# Patient Record
Sex: Female | Born: 1970 | Race: Black or African American | Hispanic: No | Smoking: Never smoker
Health system: Southern US, Community
[De-identification: ages and names within clinical notes are randomized; demographics above are authoritative.]

## PROBLEM LIST (undated history)

## (undated) DIAGNOSIS — E079 Disorder of thyroid, unspecified: Secondary | ICD-10-CM

## (undated) DIAGNOSIS — E039 Hypothyroidism, unspecified: Secondary | ICD-10-CM

## (undated) DIAGNOSIS — J45909 Unspecified asthma, uncomplicated: Secondary | ICD-10-CM

## (undated) DIAGNOSIS — R51 Headache: Secondary | ICD-10-CM

## (undated) DIAGNOSIS — K7689 Other specified diseases of liver: Secondary | ICD-10-CM

## (undated) DIAGNOSIS — R112 Nausea with vomiting, unspecified: Secondary | ICD-10-CM

## (undated) DIAGNOSIS — K589 Irritable bowel syndrome without diarrhea: Secondary | ICD-10-CM

## (undated) DIAGNOSIS — I38 Endocarditis, valve unspecified: Secondary | ICD-10-CM

## (undated) DIAGNOSIS — I499 Cardiac arrhythmia, unspecified: Secondary | ICD-10-CM

## (undated) DIAGNOSIS — Z9889 Other specified postprocedural states: Secondary | ICD-10-CM

## (undated) DIAGNOSIS — R011 Cardiac murmur, unspecified: Secondary | ICD-10-CM

## (undated) HISTORY — DX: Disorder of thyroid, unspecified: E07.9

---

## 1999-12-08 HISTORY — PX: TUBAL LIGATION: SHX77

## 1999-12-08 HISTORY — PX: PARTIAL HYSTERECTOMY: SHX80

## 2006-01-18 ENCOUNTER — Ambulatory Visit: Payer: Self-pay | Admitting: Orthopedic Surgery

## 2006-02-15 ENCOUNTER — Ambulatory Visit: Payer: Self-pay | Admitting: Orthopedic Surgery

## 2007-05-27 ENCOUNTER — Ambulatory Visit: Payer: Self-pay | Admitting: Cardiovascular Disease

## 2007-06-07 ENCOUNTER — Encounter (HOSPITAL_COMMUNITY): Admission: RE | Admit: 2007-06-07 | Discharge: 2007-07-07 | Payer: Self-pay | Admitting: Cardiovascular Disease

## 2007-06-07 ENCOUNTER — Ambulatory Visit: Payer: Self-pay | Admitting: Cardiovascular Disease

## 2007-06-08 ENCOUNTER — Ambulatory Visit (HOSPITAL_COMMUNITY): Admission: RE | Admit: 2007-06-08 | Discharge: 2007-06-08 | Payer: Self-pay | Admitting: Pulmonary Disease

## 2007-08-12 ENCOUNTER — Ambulatory Visit (HOSPITAL_COMMUNITY): Admission: RE | Admit: 2007-08-12 | Discharge: 2007-08-12 | Payer: Self-pay | Admitting: Pulmonary Disease

## 2008-12-03 ENCOUNTER — Ambulatory Visit (HOSPITAL_COMMUNITY): Admission: RE | Admit: 2008-12-03 | Discharge: 2008-12-03 | Payer: Self-pay | Admitting: Pulmonary Disease

## 2010-12-07 HISTORY — PX: SHOULDER SURGERY: SHX246

## 2011-01-21 ENCOUNTER — Other Ambulatory Visit (HOSPITAL_COMMUNITY): Payer: Self-pay | Admitting: Pulmonary Disease

## 2011-01-26 ENCOUNTER — Ambulatory Visit (HOSPITAL_COMMUNITY)
Admission: RE | Admit: 2011-01-26 | Discharge: 2011-01-26 | Disposition: A | Discharge status: Home / Self Care | Payer: BC Managed Care – PPO | Source: Ambulatory Visit | Attending: Pulmonary Disease | Admitting: Pulmonary Disease

## 2011-01-26 DIAGNOSIS — R109 Unspecified abdominal pain: Secondary | ICD-10-CM | POA: Insufficient documentation

## 2011-01-26 DIAGNOSIS — K769 Liver disease, unspecified: Secondary | ICD-10-CM | POA: Insufficient documentation

## 2011-04-21 NOTE — Assessment & Plan Note (Signed)
[facility]                       Monica Lowery CARDIOLOGY OFFICE NOTE   Monica Lowery, Monica Lowery                  MRN:          [id number]  DATE:05/27/2007                            DOB:          06/15/1971    Monica Lowery is a delightful 40 year old diabetic who was referred by  Dr. Juanetta Gosling for possibility of stress test.  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  She has never had  a stress test.  In talking to her she does occasionally get chest pain  that sounds atypical, it is usually when she is under stress.  She also  gets occasional exertional dyspnea which sounds functional.  In regards  to the patient's pain it can be nonexertional, it is sharp, it does not  radiate, there is no associated diaphoresis.  The patient's dyspnea  again tends to be when she is trying to keep up with her kids.  There is  no history of asthma or COPD.  She is a nonsmoker.   Her coronary risk factors include:  1. Juvenile onset diabetes.  2. Hypertension.  3. Hyperlipidemia.  4. There is a family history for diabetes but not premature coronary      disease.   REVIEW OF SYSTEMS:  Otherwise negative.   MEDICATIONS:  1. Zocor 40 a day.  2. Lisinopril 10 a day.  3. Synthroid 90 mcg a day.  4. As aspirin a day.  5. Lantus 30 at night.  6. NovoLog.   PAST MEDICAL HISTORY:  Primary remarkable for her risk factors and  diabetes.  She has had a tubal ligation in 1999.   She subsequently had a hysterectomy in 2001, there has been no other  surgeries, THERE IS NO KNOWN ALLERGIES.  The patient is married, she  lives in [place].  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  She has an 105-year-old, 64-year-old, and a 13-year-  old and is fairly active with them.  She does not do a lot of physical  activity.  The patient does not drink or smoke.   EXAMINATION:  Remarkable for a weight of 178, blood  pressure 120/70,  pulse is 80 and regular, she is afebrile, her respiratory rate is 14.  HEENT:  Normal, there is no thyromegaly, no lymphadenopathy, no JVP  elevation, no carotid bruits.  LUNGS:  Clear with good diaphragmatic motion.  There is an S1-S2 with normal heart sounds, PMI is not palpable.  There  was bowel sounds, positive, no tenderness, no hepatosplenomegaly or  hepatojugular reflux, no AAA.  Distal pulses are intact.  There is  really no edema.  There is no significant neuropathy.  PTs are +3.  NEURO:  Nonfocal.  There is no muscular weakness.  Her baseline EKG is  normal.   FAMILY HISTORY:  Mother and father both alive at age 36 without  significant coronary disease and one brother, one sister both alive and  well. Positive for diabetes   IMPRESSION:  1. Chest pressure.  Probably not cardiac but given the patient's  juvenile onset diabetes she will be referred for a stress Myoview      study.  2. Hyperlipidemia.  In the setting of diabetes continue Zocor 40 mg a      day.  Followup lipid and liver profile in 6 months.  3. Blood pressure.  Stable on lisinopril, continue for protein sparing      effects in the urine.  4. Hypothyroidism.  Continue current dose of Armour Thyroid, check TSH      in 6 months.  5. Diabetes.  To be followed by Dr. Juanetta Gosling.  Apparently the patient's      hemoglobin A1c has been in the 8-9 range lately.  She admits to      poor dietary control over the summertime.  She will work on this.      I explained to her the importance of controlling her diabetes in      regards to her risk for vascular disease.  6. Anticoagulation.  Baby aspirin a day for stroke prophylaxis.  7. Patient does complain of some mild lower extremity edema.  I think      that it has more to do with salt intake and the heat.  There is no      evidence of right or left sided congestive heart failure.  We will      check her ejection fraction when she has her stress  test.     Theron Arista C. Eden Emms, MD, [facility]  Electronically Signed    PCN/MedQ  DD: 05/27/2007  DT: 05/27/2007  Job #: [id number]

## 2011-10-21 ENCOUNTER — Ambulatory Visit (HOSPITAL_COMMUNITY)
Admission: RE | Admit: 2011-10-21 | Discharge: 2011-10-21 | Disposition: A | Discharge status: Home / Self Care | Payer: BC Managed Care – PPO | Source: Ambulatory Visit | Attending: Orthopedic Surgery | Admitting: Orthopedic Surgery

## 2011-10-21 DIAGNOSIS — IMO0001 Reserved for inherently not codable concepts without codable children: Secondary | ICD-10-CM | POA: Insufficient documentation

## 2011-10-21 DIAGNOSIS — M25519 Pain in unspecified shoulder: Secondary | ICD-10-CM | POA: Insufficient documentation

## 2011-10-21 DIAGNOSIS — M7500 Adhesive capsulitis of unspecified shoulder: Secondary | ICD-10-CM | POA: Insufficient documentation

## 2011-10-21 DIAGNOSIS — M6281 Muscle weakness (generalized): Secondary | ICD-10-CM | POA: Insufficient documentation

## 2011-10-21 DIAGNOSIS — M25619 Stiffness of unspecified shoulder, not elsewhere classified: Secondary | ICD-10-CM | POA: Insufficient documentation

## 2011-10-21 NOTE — Progress Notes (Signed)
Occupational Therapy Evaluation  Patient Details  Name: Monica Lowery MRN: [id number] Date of Birth: 1971/01/23  Today's Date: 10/21/2011 Time: 1600-1700 Time Calculation (min): 60 min OT Eval [phone] Manual THerapy 418-440 Ice [phone] Visit#: 1  of 16   Re-eval: 11/18/11  Assessment Diagnosis: S/P Left Shoulder Scope, Manipulation, and SAD Surgical Date: 10/16/11 Next MD Visit: 10/28/11 Prior Therapy: no  Past Medical History: No past medical history on file. Past Surgical History: No past surgical history on file.  Subjective Symptoms/Limitations Symptoms: S:  I had surgery on the 9th.  I want to be able to gain full use of my shoulder and avoid it freezing up. Limitations: History:  Monica Lowery has had decreased mobility and increased pain in her left shoulder for at least one year.  She has been recieving cortisone injections periodically, but decided to have surgery when her symptoms were no longer alleviated by the injections.  She had surgery on 10/16/11.  She has been referred to occupational therapy for evaluation and treatment to increase AROM and scapular stability. Pain Assessment Currently in Pain?: Yes Pain Score:   4 Pain Location: Shoulder Pain Orientation: Left Pain Type: Acute pain   Assessment LUE Assessment LUE Assessment:  (assessed in supine ext and int rot with 0 abd) LUE AROM (degrees) Left Shoulder Flexion  0-170: 50 Degrees Left Shoulder ABduction 0-40: 35 Degrees Left Shoulder Internal Rotation  0-70: 50 Degrees Left Shoulder External Rotation  0-90: 0 Degrees LUE PROM (degrees) Left Shoulder Flexion  0-170: 80 Degrees Left Shoulder ABduction 0-40: 40 Degrees Left Shoulder Internal Rotation  0-70: 50 Degrees Left Shoulder External Rotation  0-90: 10 Degrees  Exercise/Treatments Therapeutic Ex:  PROM to shoulder all ranges x 5 reps  Modalities Modalities: Cryotherapy Cryotherapy Number Minutes Cryotherapy: 10  Minutes Cryotherapy Location: Shoulder Type of Cryotherapy: Ice pack Manual Therapy Manual Therapy: Myofascial release Myofascial Release: MFR and manual stretching to left upper arm, scapular, and shoulder region to decrease pain and increase A/PROM to [facility]. [phone]  Occupational Therapy Assessment and Plan OT Assessment and Plan Clinical Impression Statement: A:  Patient presents with decreased AROM and strength and increased pain and restrictions in her left shoulder causing decreased I with BADLS. Rehab Potential: Excellent OT Frequency: Min 2X/week OT Duration: 8 weeks OT Treatment/Interventions: Self-care/ADL training;Therapeutic exercise;Manual therapy;Therapeutic activities;Patient/family education;Other (comment) (modalities PRN, HEP:  shld stretch towel slides) OT Plan: P:  Skilled OT intervention to increase AROM and strength and decrease pain and restrictions:  Treatment Plan:  MFR and manual stretching.  Supine PROM and dowel exercises, seated elev, ext, ret, row.  ball stretches, pulleys.  progress as tolerated.   Goals Short Term Goals Time to Complete Short Term Goals: 4 weeks Short Term Goal 1: Patient will be educated on HEP. Short Term Goal 2: Patient will increase left shoulder PROM to [facility] for increased ability to don deoderant and wash under her arm. 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 Short Term Goal 4: Patient will decrease pain to 2/10 with activity. Short Term Goal 5: Patient will decrease fascial restrictions from max to mod in her left shoulder region. Long Term Goals Time to Complete Long Term Goals: 8 weeks Long Term Goal 1: Patient will complete all B/IADLs, work, and leisure activities with LUE. Long Term Goal 2: Patient will increase left shoulder AROM to WNL for increased ability to place items in overhead cabinets. 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 Long Term Goal 4: Patient will decrease pain to 1/10 in her left shoulder while working. Long Term Goal 5: Patient will decrease fasical restrictions to trace in her left shoulder region. End of Session Patient Active Problem List  Diagnoses  . Frozen shoulder  . Pain in joint, shoulder region  . Muscle weakness (generalized)   End of Session Activity Tolerance: Patient tolerated treatment well General Behavior During Session: [facility] for tasks performed Cognition: [facility] for tasks performed  Time Calculation Start Time: 1600 Stop Time: 1700 Time Calculation (min): 60 min  Shirlean Mylar, OTR/L  10/21/2011, 5:34 PM  Physician Documentation Your signature is required to indicate approval of the treatment plan as stated above.  Please sign and either send electronically or make a copy of this report for your files and return this physician signed original.  Please mark one 1.__approve of plan  2. ___approve of plan with the following conditions.   ______________________________                                                          _____________________ Physician Signature                                                                                                             Date

## 2011-10-23 ENCOUNTER — Ambulatory Visit (HOSPITAL_COMMUNITY)
Admission: RE | Admit: 2011-10-23 | Discharge: 2011-10-23 | Disposition: A | Discharge status: Home / Self Care | Payer: BC Managed Care – PPO | Source: Ambulatory Visit | Attending: Pulmonary Disease | Admitting: Pulmonary Disease

## 2011-10-23 DIAGNOSIS — M6281 Muscle weakness (generalized): Secondary | ICD-10-CM

## 2011-10-23 DIAGNOSIS — M7500 Adhesive capsulitis of unspecified shoulder: Secondary | ICD-10-CM

## 2011-10-23 DIAGNOSIS — I369 Nonrheumatic tricuspid valve disorder, unspecified: Secondary | ICD-10-CM

## 2011-10-23 DIAGNOSIS — M25519 Pain in unspecified shoulder: Secondary | ICD-10-CM

## 2011-10-23 DIAGNOSIS — R609 Edema, unspecified: Secondary | ICD-10-CM | POA: Insufficient documentation

## 2011-10-23 DIAGNOSIS — E119 Type 2 diabetes mellitus without complications: Secondary | ICD-10-CM | POA: Insufficient documentation

## 2011-10-23 NOTE — Progress Notes (Signed)
Occupational Therapy Treatment  Patient Details  Name: Monica Lowery MRN: [id number] Date of Birth: [birth year]  Today's Date: 10/23/2011 Time: 2130-8657 Time Calculation (min): 58 min Manual Therapy [phone] 26' Therapeutic Exercises [phone] 21' Ice 10' Visit#: 2  of 16   Re-eval: 11/18/11    Subjective Symptoms/Limitations Symptoms: S:  I tried some of the exercises.  Is it normal to have swelling. Limitations: Discussed possible reasons for swelling in LUE s/p surgery. Pain Assessment Currently in Pain?: Yes Pain Score:   4 Pain Location: Shoulder Pain Orientation: Left Pain Type: Acute pain  Exercise/Treatments Supine Protraction: PROM;10 reps Horizontal ABduction: PROM;10 reps External Rotation: PROM;10 reps Internal Rotation: PROM;10 reps Flexion: PROM;10 reps ABduction: PROM;10 reps Seated Elevation: AROM;10 reps Extension: AROM;10 reps Retraction: AROM;10 reps Row: AROM;10 reps Protraction: AROM;10 reps Pulleys Flexion: 1 minute ABduction: 1 minute Therapy Ball Flexion: 15 reps ABduction: 15 reps  Isometric Strengthening   Flexion: Supine;3X3" Extension: Supine;3X3" External Rotation: Supine;3X3" Internal Rotation: Supine;3X3" ABduction: Supine;3X3" ADduction: Supine;3X3" Modalities Modalities: Cryotherapy at conclusion of session. Cryotherapy Number Minutes Cryotherapy: 10 Minutes Cryotherapy Location: Shoulder Type of Cryotherapy: Ice pack  Manual Therapy Manual Therapy: Myofascial release Myofascial Release: MFR and manual stretching to left upper arm, scapular, and shoulder region to decrease pain and increase A/PROM to [facility].  [phone]  Occupational Therapy Assessment and Plan OT Assessment and Plan Clinical Impression Statement: A:  Increased ability to PROM flex and abd, ext rot continues to be extremely tight. OT Plan: P:  Increase PROM by 10 in supine.   Goals Short Term Goals Time to Complete Short Term Goals: 4 weeks Short  Term Goal 1: Patient will be educated on HEP. Short Term Goal 2: Patient will increase left shoulder PROM to [facility] for increased ability to don deoderant and wash under her arm. 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 Short Term Goal 4: Patient will decrease pain to 2/10 with activity. Short Term Goal 5: Patient will decrease fascial restrictions from max to mod in her left shoulder region. Long Term Goals Time to Complete Long Term Goals: 8 weeks Long Term Goal 1: Patient will complete all B/IADLs, work, and leisure activities with LUE. Long Term Goal 2: Patient will increase left shoulder AROM to WNL for increased ability to place items in overhead cabinets. 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 Long Term Goal 4: Patient will decrease pain to 1/10 in her left shoulder while working. Long Term Goal 5: Patient will decrease fasical restrictions to trace in her left shoulder region. End of Session Patient Active Problem List  Diagnoses  . Frozen shoulder  . Pain in joint, shoulder region  . Muscle weakness (generalized)   End of Session Activity Tolerance: Patient tolerated treatment well General Behavior During Session: [facility] for tasks performed Cognition: [facility] for tasks performed   Shirlean Mylar, OTR/L  10/23/2011, 9:25 AM

## 2011-10-23 NOTE — Progress Notes (Signed)
*  PRELIMINARY RESULTS* Echocardiogram 2D Echocardiogram has been performed.  Monica Lowery 10/23/2011, 9:22 AM

## 2011-10-26 ENCOUNTER — Ambulatory Visit (HOSPITAL_COMMUNITY)
Admission: RE | Admit: 2011-10-26 | Discharge: 2011-10-26 | Disposition: A | Discharge status: Home / Self Care | Payer: BC Managed Care – PPO | Source: Ambulatory Visit | Attending: Pulmonary Disease | Admitting: Pulmonary Disease

## 2011-10-26 DIAGNOSIS — M25519 Pain in unspecified shoulder: Secondary | ICD-10-CM

## 2011-10-26 DIAGNOSIS — M7500 Adhesive capsulitis of unspecified shoulder: Secondary | ICD-10-CM

## 2011-10-26 DIAGNOSIS — M6281 Muscle weakness (generalized): Secondary | ICD-10-CM

## 2011-10-26 NOTE — Progress Notes (Signed)
Occupational Therapy Treatment  Patient Details  Name: Monica Lowery MRN: [id number] Date of Birth: 1971/01/14  Today's Date: 10/26/2011 Time: 4401-0272 Time Calculation (min): 54 min Visit#: 3  of 16   Re-eval: 11/18/11 [facility]  [phone] 47' Therapeutic Exercise [phone] 13'    Subjective Symptoms/Limitations Symptoms: S:  I have not taken any pain meds cause I had to drive. Pain Assessment Currently in Pain?: Yes Pain Score:   3 Pain Location: Shoulder Pain Orientation: Left Pain Type: Acute pain  Exercise/Treatments Supine Protraction: PROM;10 reps Horizontal ABduction: PROM;10 reps External Rotation: PROM;10 reps Internal Rotation: PROM;10 reps Flexion: PROM;10 reps ABduction: PROM;10 reps Seated Elevation: AROM;10 reps Extension: AROM;10 reps Retraction: AROM;10 reps Row: AROM;10 reps Pulleys Flexion: 2 minutes ABduction: 2 minutes Therapy Ball Flexion: 20 reps ABduction: 20 reps ROM / Strengthening / Isometric Strengthening Flexion: Supine;5X5" Extension: Supine;5X5" External Rotation: Supine;5X5" Internal Rotation: Supine;5X5" ABduction: Supine;5X5" ADduction: Supine;5X5"   Manual Therapy Manual Therapy: Myofascial release Myofascial Release: MFR and manual stretching to left upper arm, scapular, and shoulder region to decrease pain and increase A/PROM to [facility].   Occupational Therapy Assessment and Plan OT Assessment and Plan Clinical Impression Statement: A:  Increased edema in upper trap and overall general tightness.   Encouraged patient to be consisted with HEP and use heat or ice which ever works for her. OT Plan: P:  Contiinue to increase PROM as pateint tolerates.   Goals Short Term Goals Time to Complete Short Term Goals: 4 weeks Short Term Goal 1: Patient will be educated on HEP. Short Term Goal 2: Patient will increase left shoulder PROM to [facility] for increased ability to don deoderant and wash under her arm. Short Term  Goal 3: Patient will increase left shoulder strength to 3+/5 for increased ability to restrain students at work. Short Term Goal 4: Patient will decrease pain to 2/10 with activity. Short Term Goal 5: Patient will decrease fascial restrictions from max to mod in her left shoulder region. Long Term Goals Time to Complete Long Term Goals: 8 weeks Long Term Goal 1: Patient will complete all B/IADLs, work, and leisure activities with LUE. Long Term Goal 2: Patient will increase left shoulder AROM to WNL for increased ability to place items in overhead cabinets. 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 Long Term Goal 4: Patient will decrease pain to 1/10 in her left shoulder while working. Long Term Goal 5: Patient will decrease fasical restrictions to trace in her left shoulder region. End of Session Patient Active Problem List  Diagnoses  . Frozen shoulder  . Pain in joint, shoulder region  . Muscle weakness (generalized)   End of Session Activity Tolerance: Patient tolerated treatment well General Behavior During Session: [facility] for tasks performed Cognition: [facility] for tasks performed   Tajae Rybicki L. Bridgitte Felicetti, COTA/L  10/26/2011, 5:56 PM

## 2011-10-27 ENCOUNTER — Ambulatory Visit (HOSPITAL_COMMUNITY)
Admission: RE | Admit: 2011-10-27 | Discharge: 2011-10-27 | Disposition: A | Discharge status: Home / Self Care | Payer: BC Managed Care – PPO | Source: Ambulatory Visit | Attending: Pulmonary Disease | Admitting: Pulmonary Disease

## 2011-10-27 DIAGNOSIS — M25519 Pain in unspecified shoulder: Secondary | ICD-10-CM

## 2011-10-27 DIAGNOSIS — M7500 Adhesive capsulitis of unspecified shoulder: Secondary | ICD-10-CM

## 2011-10-27 DIAGNOSIS — M6281 Muscle weakness (generalized): Secondary | ICD-10-CM

## 2011-10-27 NOTE — Progress Notes (Signed)
Occupational Therapy Treatment  Patient Details  Name: Monica Lowery MRN: [id number] Date of Birth: Aug 09, 1971  Today's Date: 10/27/2011 Time: 7829-5621 Time Calculation (min): 77 min Visit#: 4  of 16   Re-eval: 11/18/11 [facility] [phone]  84' Therapeutic Exercise  [phone] 14' IFES with ice  [phone]  15'    Subjective Symptoms/Limitations Symptoms: S:  The doctor said I was not doing well.  He is not happy with where I am.   Exercise/Treatments Supine Protraction: PROM;10 reps Horizontal ABduction: PROM;10 reps External Rotation: PROM;10 reps Internal Rotation: PROM;10 reps Flexion: PROM;10 reps ABduction: PROM;10 reps Seated Elevation: AROM;10 reps Extension: AROM;10 reps Retraction: AROM;10 reps Row: AROM;10 reps Pulleys Flexion:  (time) ABduction:  (time) Therapy Ball Flexion: 20 reps ABduction: 20 reps ROM / Strengthening / Isometric Strengthening   Flexion: Supine;5X5" Extension: Supine;5X5" External Rotation: Supine;5X5" Internal Rotation: Supine;5X5" ABduction: Supine;5X5" ADduction: Supine;5X5"  Modalities Modalities: Cryotherapy;Electrical Stimulation Manual Therapy Manual Therapy: Myofascial release Myofascial Release: MFR and manual stretching to left upper arm, scapular, and shoulder region to decrease pain and increase A/PROM to WFL> Cryotherapy Number Minutes Cryotherapy: 15 Minutes Cryotherapy Location: Shoulder Type of Cryotherapy: Ice pack Pharmacologist Location: Left shoulder Electrical Stimulation Action: IFES Electrical Stimulation Parameters: Hi/Low sweep current 22 Electrical Stimulation Goals: Pain;Edema  Occupational Therapy Assessment and Plan OT Assessment and Plan Clinical Impression Statement: A:  Added IFES with ice to help decrease pain and edema.  Patient stated she felt she had decreased dtiffness after treatment. Rehab Potential: Excellent OT Plan: Resume pulleys.    Goals Short Term Goals Time to Complete Short Term Goals: 4 weeks Short Term Goal 1: Patient will be educated on HEP. Short Term Goal 2: Patient will increase left shoulder PROM to [facility] for increased ability to don deoderant and wash under her arm. 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 Short Term Goal 4: Patient will decrease pain to 2/10 with activity. Short Term Goal 5: Patient will decrease fascial restrictions from max to mod in her left shoulder region. Long Term Goals Time to Complete Long Term Goals: 8 weeks Long Term Goal 1: Patient will complete all B/IADLs, work, and leisure activities with LUE. Long Term Goal 2: Patient will increase left shoulder AROM to WNL for increased ability to place items in overhead cabinets. 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 Long Term Goal 4: Patient will decrease pain to 1/10 in her left shoulder while working. Long Term Goal 5: Patient will decrease fasical restrictions to trace in her left shoulder region. End of Session Patient Active Problem List  Diagnoses  . Frozen shoulder  . Pain in joint, shoulder region  . Muscle weakness (generalized)   End of Session Activity Tolerance: Patient tolerated treatment well General Behavior During Session: [facility] for tasks performed Cognition: [facility] for tasks performed   Karalina Tift L. Wendelin Bradt, COTA/L  10/27/2011, 4:50 PM

## 2011-10-28 ENCOUNTER — Ambulatory Visit (HOSPITAL_COMMUNITY): Payer: BC Managed Care – PPO | Admitting: Occupational Therapy

## 2011-11-03 ENCOUNTER — Ambulatory Visit (HOSPITAL_COMMUNITY)
Admission: RE | Admit: 2011-11-03 | Discharge: 2011-11-03 | Disposition: A | Discharge status: Home / Self Care | Payer: BC Managed Care – PPO | Source: Ambulatory Visit | Attending: Occupational Therapy | Admitting: Occupational Therapy

## 2011-11-03 DIAGNOSIS — M25519 Pain in unspecified shoulder: Secondary | ICD-10-CM

## 2011-11-03 DIAGNOSIS — M7500 Adhesive capsulitis of unspecified shoulder: Secondary | ICD-10-CM

## 2011-11-03 DIAGNOSIS — M6281 Muscle weakness (generalized): Secondary | ICD-10-CM

## 2011-11-03 NOTE — Progress Notes (Signed)
Occupational Therapy Treatment  Patient Details  Name: Monica Lowery MRN: [id number] Date of Birth: 08-Apr-1971  Today's Date: 11/03/2011 Time: 1308-6578 Time Calculation (min): 88 min Visit#: 5  of 16   Re-eval: 11/18/11. [facility]  [phone] 52' Therapeutic exercise  [phone] 74 IFES with ice [phone]    Subjective Symptoms/Limitations Symptoms: S:  I had two really good days after that machine.   Exercise/Treatments  11/03/11 1753 Shoulder Exercises: Supine Protraction PROM;10 reps;AAROM;12 reps Horizontal ABduction PROM;10 reps;AAROM;12 reps External Rotation PROM;10 reps;AAROM;12 reps Internal Rotation PROM;10 reps;AAROM;12 reps Flexion PROM;10 reps;AAROM;12 reps ABduction PROM;10 reps;AAROM;12 reps Shoulder Exercises: Seated Elevation AROM;12 reps Extension AROM;12 reps Retraction AROM;12 reps Row AROM;12 reps Shoulder Exercises: Pulleys Flexion 3 minutes ABduction 3 minutes Shoulder Exercises: Therapy Ball Flexion 20 reps ABduction 20 reps Shoulder Exercises: ROM/Strengthening Wall Wash 2' Thumb Tacks 1' Prot/Ret//Elev/Dep 1'   Modalities Modalities: Electrical Stimulation;Cryotherapy Manual Therapy Manual Therapy: Myofascial release Myofascial Release: MFR and manual stretching to left upper arm, scapular, and shoulder region to decrease pain and increase A/PROM to [facility].  Cryotherapy Number Minutes Cryotherapy: 15 Minutes Cryotherapy Location: Shoulder Type of Cryotherapy: Ice pack Pharmacologist Location: Left shoulder Electrical Stimulation Action: IFES Electrical Stimulation Parameters: Hi/low sweep current 22 Electrical Stimulation Goals: Pain;Edema  Occupational Therapy Assessment and Plan OT Assessment and Plan Clinical Impression Statement: A:  Added wall wash, thumbtacks and prot/ret/elev/dep.  Added dowel in supine and HEP.  D/C isometrics. Rehab Potential: Excellent OT Plan: P: Increase reps.    Goals Short Term Goals Time to Complete Short Term Goals: 4 weeks Short Term Goal 1: Patient will be educated on HEP. Short Term Goal 2: Patient will increase left shoulder PROM to [facility] for increased ability to don deoderant and wash under her arm. 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 Short Term Goal 4: Patient will decrease pain to 2/10 with activity. Short Term Goal 5: Patient will decrease fascial restrictions from max to mod in her left shoulder region. Long Term Goals Time to Complete Long Term Goals: 8 weeks Long Term Goal 1: Patient will complete all B/IADLs, work, and leisure activities with LUE. Long Term Goal 2: Patient will increase left shoulder AROM to WNL for increased ability to place items in overhead cabinets. 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 Long Term Goal 4: Patient will decrease pain to 1/10 in her left shoulder while working. Long Term Goal 5: Patient will decrease fasical restrictions to trace in her left shoulder region. End of Session Patient Active Problem List  Diagnoses  . Frozen shoulder  . Pain in joint, shoulder region  . Muscle weakness (generalized)   End of Session Activity Tolerance: Patient tolerated treatment well General Behavior During Session: [facility] for tasks performed Cognition: [facility] for tasks performed   [employer] [place] L. Illianna Paschal, COTA/L  11/03/2011, 6:01 PM

## 2011-11-05 ENCOUNTER — Inpatient Hospital Stay (HOSPITAL_COMMUNITY): Admission: RE | Admit: 2011-11-05 | Payer: BC Managed Care – PPO | Source: Ambulatory Visit | Admitting: Specialist

## 2011-11-11 ENCOUNTER — Inpatient Hospital Stay (HOSPITAL_COMMUNITY)
Admission: RE | Admit: 2011-11-11 | Payer: BC Managed Care – PPO | Source: Ambulatory Visit | Admitting: Occupational Therapy

## 2011-11-13 ENCOUNTER — Ambulatory Visit (HOSPITAL_COMMUNITY)
Admission: RE | Admit: 2011-11-13 | Discharge: 2011-11-13 | Disposition: A | Discharge status: Home / Self Care | Payer: BC Managed Care – PPO | Source: Ambulatory Visit | Attending: Orthopedic Surgery | Admitting: Orthopedic Surgery

## 2011-11-13 DIAGNOSIS — M25519 Pain in unspecified shoulder: Secondary | ICD-10-CM | POA: Insufficient documentation

## 2011-11-13 DIAGNOSIS — M25619 Stiffness of unspecified shoulder, not elsewhere classified: Secondary | ICD-10-CM | POA: Insufficient documentation

## 2011-11-13 DIAGNOSIS — M6281 Muscle weakness (generalized): Secondary | ICD-10-CM | POA: Insufficient documentation

## 2011-11-13 DIAGNOSIS — IMO0001 Reserved for inherently not codable concepts without codable children: Secondary | ICD-10-CM | POA: Insufficient documentation

## 2011-11-13 DIAGNOSIS — M7500 Adhesive capsulitis of unspecified shoulder: Secondary | ICD-10-CM

## 2011-11-13 NOTE — Progress Notes (Signed)
Occupational Therapy Treatment  Patient Details  Name: Monica Lowery MRN: [id number] Date of Birth: 11/09/1971  Today's Date: 11/13/2011 Time: 4098-1191 Time Calculation (min): 42 min Visit#: 6  of 16   Re-eval: 12/11/11 Manual Therapy [phone] 20' Reassess ROM [phone] 10' IFES with ice [phone] 10'  Subjective Symptoms/Limitations Symptoms: S:  It feels as bad as it did the first day I came cause I have not been able to come. Pain Assessment Currently in Pain?: Yes Pain Score:   6 Pain Location: Shoulder Pain Orientation: Left Pain Type: Acute pain  Exercise/Treatments Supine Protraction: PROM;10 reps Horizontal ABduction: PROM;10 reps External Rotation: PROM;10 reps Internal Rotation: PROM;10 reps Flexion: PROM;10 reps ABduction: PROM;10 reps   Manual Therapy Manual Therapy: Myofascial release Myofascial Release: MFR and manual stretching to left upper arm, scapular, and shoulder region to decrease pain and increase A/PROM to [facility]. Cryotherapy Number Minutes Cryotherapy: 15 Minutes Cryotherapy Location: Shoulder Type of Cryotherapy: Ice pack Pharmacologist Location: Left shoulder Electrical Stimulation Action: IFES Electrical Stimulation Parameters: Hi/Low  Occupational Therapy Assessment and Plan OT Assessment and Plan Clinical Impression Statement: A:  Unable to complete exercises secondary to needing to leave to pick up children.  See progress note. Rehab Potential: Excellent OT Plan: P:  Resume exercises.   Goals Short Term Goals Time to Complete Short Term Goals: 4 weeks Short Term Goal 1: Patient will be educated on HEP. Short Term Goal 1 Progress: Progressing toward goal Short Term Goal 2: Patient will increase left shoulder PROM to [facility] for increased ability to don deoderant and wash under her arm. 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 Short Term Goal 3 Progress: Progressing toward goal Short Term Goal 4: Patient will decrease pain to 2/10 with activity. Short Term Goal 4 Progress: Progressing toward goal Short Term Goal 5: Patient will decrease fascial restrictions from max to mod in her left shoulder region. Short Term Goal 5 Progress: Progressing toward goal Long Term Goals Time to Complete Long Term Goals: 8 weeks Long Term Goal 1: Patient will complete all B/IADLs, work, and leisure activities with LUE. Long Term Goal 1 Progress: Progressing toward goal Long Term Goal 2: Patient will increase left shoulder AROM to WNL for increased ability to place items in overhead cabinets. 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 Long Term Goal 3 Progress: Progressing toward goal Long Term Goal 4: Patient will decrease pain to 1/10 in her left shoulder while working. Long Term Goal 4 Progress: Progressing toward goal Long Term Goal 5: Patient will decrease fasical restrictions to trace in her left shoulder region. Long Term Goal 5 Progress: Progressing toward goal End of Session Patient Active Problem List  Diagnoses  . Frozen shoulder  . Pain in joint, shoulder region  . Muscle weakness (generalized)   End of Session Activity Tolerance: Patient limited by pain General Behavior During Session: [facility] for tasks performed Cognition: [facility] for tasks performed   Pilot Prindle L. Noralee Stain, COTA/L  11/13/2011, 5:36 PM

## 2011-11-18 ENCOUNTER — Ambulatory Visit (HOSPITAL_COMMUNITY)
Admission: RE | Admit: 2011-11-18 | Discharge: 2011-11-18 | Disposition: A | Discharge status: Home / Self Care | Payer: BC Managed Care – PPO | Source: Ambulatory Visit | Attending: Occupational Therapy | Admitting: Occupational Therapy

## 2011-11-18 DIAGNOSIS — M6281 Muscle weakness (generalized): Secondary | ICD-10-CM

## 2011-11-18 DIAGNOSIS — M7500 Adhesive capsulitis of unspecified shoulder: Secondary | ICD-10-CM

## 2011-11-18 DIAGNOSIS — M25519 Pain in unspecified shoulder: Secondary | ICD-10-CM

## 2011-11-18 NOTE — Progress Notes (Signed)
Occupational Therapy Treatment  Patient Details  Name: CANDIDA VETTER MRN: [id number] Date of Birth: 1971/07/18  Today's Date: 11/18/2011 Time: 6295-2841 Time Calculation (min): 107 min Visit#: 7  of 16   Re-eval: 12/11/11 Manual Therapy [phone] 35' Therapeutic Exercise 451-545 84' IFES with ice 15'    Subjective Symptoms/Limitations Symptoms: S: He said he wants me to come 3 times a week.  He said if I don't gain in two weeks I will not get it back. Pain Assessment Currently in Pain?: Yes Pain Score:   1 Pain Location: Shoulder Pain Orientation: Left   Exercise/Treatments Supine Protraction: PROM;10 reps;AAROM;12 reps Horizontal ABduction: PROM;10 reps;AAROM;12 reps External Rotation: PROM;10 reps;AAROM;12 reps Internal Rotation: PROM;10 reps;AAROM;12 reps Flexion: PROM;10 reps;AAROM;12 reps ABduction: PROM;10 reps;AAROM;12 reps Seated Elevation: AROM;12 reps Extension: AROM;12 reps Retraction: AROM;12 reps Row: AROM;12 reps Pulleys Flexion: 3 minutes ABduction: 3 minutes Therapy Ball Flexion: 25 reps ABduction: 25 reps Right/Left: 5 reps ROM / Strengthening / Isometric Strengthening UBE (Upper Arm Bike): 3' forward, 3' reverse 1.0 Wall Wash: 3' Thumb Tacks: 1' Prot/Ret//Elev/Dep: 1'    Modalities Modalities: Electrical Stimulation Manual Therapy Manual Therapy: Myofascial release Myofascial Release: MFR and manual stretching to left upper arm, scapular and shoulder region to decrease pain and increase A/PROM to [facility].  Patient prone for rebounding to assist in decreasing resistance to stretching and increase ROM. Cryotherapy Number Minutes Cryotherapy: 15 Minutes Cryotherapy Location: Shoulder Type of Cryotherapy: Ice pack Pharmacologist Location: Left shoulder Electrical Stimulation Action: IFES Electrical Stimulation Parameters: Hi/low Statistician Goals: Pain;Edema  Occupational Therapy Assessment and  Plan OT Assessment and Plan Clinical Impression Statement: A:  Multiple new exercises and tech added to increase ROM which patient tolerated well. Rehab Potential: Excellent OT Plan: P:  Increase to 3x a week per MD order.   Goals Short Term Goals Time to Complete Short Term Goals: 4 weeks Short Term Goal 1: Patient will be educated on HEP. Short Term Goal 2: Patient will increase left shoulder PROM to [facility] for increased ability to don deoderant and wash under her arm. 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 Short Term Goal 4: Patient will decrease pain to 2/10 with activity. Short Term Goal 5: Patient will decrease fascial restrictions from max to mod in her left shoulder region. Long Term Goals Time to Complete Long Term Goals: 8 weeks Long Term Goal 1: Patient will complete all B/IADLs, work, and leisure activities with LUE. Long Term Goal 2: Patient will increase left shoulder AROM to WNL for increased ability to place items in overhead cabinets. 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 Long Term Goal 4: Patient will decrease pain to 1/10 in her left shoulder while working. Long Term Goal 5: Patient will decrease fasical restrictions to trace in her left shoulder region. End of Session Patient Active Problem List  Diagnoses  . Frozen shoulder  . Pain in joint, shoulder region  . Muscle weakness (generalized)   End of Session Activity Tolerance: Patient tolerated treatment well General Behavior During Session: [facility] for tasks performed Cognition: [facility] for tasks performed  Anali Cabanilla L. Noralee Stain, COTA/L  11/18/2011, 6:19 PM

## 2011-11-20 ENCOUNTER — Ambulatory Visit (HOSPITAL_COMMUNITY)
Admission: RE | Admit: 2011-11-20 | Discharge: 2011-11-20 | Disposition: A | Discharge status: Home / Self Care | Payer: BC Managed Care – PPO | Source: Ambulatory Visit | Attending: Pulmonary Disease | Admitting: Pulmonary Disease

## 2011-11-20 DIAGNOSIS — M25519 Pain in unspecified shoulder: Secondary | ICD-10-CM

## 2011-11-20 DIAGNOSIS — M6281 Muscle weakness (generalized): Secondary | ICD-10-CM

## 2011-11-20 DIAGNOSIS — M7500 Adhesive capsulitis of unspecified shoulder: Secondary | ICD-10-CM

## 2011-11-20 NOTE — Progress Notes (Signed)
Occupational Therapy Treatment  Patient Details  Name: PRINCETTA UPLINGER MRN: [id number] Date of Birth: 1971/07/19  Today's Date: 11/20/2011 Time: 4098-1191 Time Calculation (min): 64 min Visit#: 8  of 16   Re-eval: 12/11/11 Manual Therapy 455-519 24' Therapeutic Exercise 520-559 48'    Subjective Symptoms/Limitations Symptoms: S:  It does not hurt like I thought it would. Pain Assessment Pain Score: 0-No pain  Exercise/Treatments Supine Protraction: PROM;10 reps;AAROM;15 reps Horizontal ABduction: PROM;10 reps;AAROM;15 reps External Rotation: PROM;10 reps;AAROM;15 reps Internal Rotation: PROM;10 reps;AAROM;15 reps Flexion: PROM;10 reps;AAROM;15 reps ABduction: PROM;10 reps;AAROM;15 reps Seated Elevation: AROM;12 reps Extension: AROM;12 reps Retraction: AROM;12 reps Pulleys Flexion: 3 minutes ABduction: 3 minutes Therapy Ball Flexion: 25 reps ABduction: 25 reps Right/Left: 5 reps ROM / Strengthening / Isometric Strengthening UBE (Upper Arm Bike): 3' forward, 3' reverse 1.5 Wall Wash: 3' Thumb Tacks: 1' Prot/Ret//Elev/Dep: 1'  Manual Therapy Manual Therapy: Myofascial release Myofascial Release: MFR and manual stretching to left upper arm, scapular and shoulder region to decrease pain and increase A/PROM to [facility].  Patient prone for rebounding to assist in decreasing resistance to stretching and increase ROM.  Occupational Therapy Assessment and Plan OT Assessment and Plan Clinical Impression Statement: A: Added proximal shoulder stretch. Rehab Potential: Excellent OT Plan: P:  Continue to increase ROM.   Goals Short Term Goals Time to Complete Short Term Goals: 4 weeks Short Term Goal 1: Patient will be educated on HEP. Short Term Goal 2: Patient will increase left shoulder PROM to [facility] for increased ability to don deoderant and wash under her arm. Short Term Goal 3: Patient will increase left shoulder strength to 3+/5 for increased ability to restrain  students at work. Short Term Goal 4: Patient will decrease pain to 2/10 with activity. Short Term Goal 5: Patient will decrease fascial restrictions from max to mod in her left shoulder region. Long Term Goals Time to Complete Long Term Goals: 8 weeks Long Term Goal 1: Patient will complete all B/IADLs, work, and leisure activities with LUE. Long Term Goal 2: Patient will increase left shoulder AROM to WNL for increased ability to place items in overhead cabinets. 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 Long Term Goal 4: Patient will decrease pain to 1/10 in her left shoulder while working. Long Term Goal 5: Patient will decrease fasical restrictions to trace in her left shoulder region. End of Session Patient Active Problem List  Diagnoses  . Frozen shoulder  . Pain in joint, shoulder region  . Muscle weakness (generalized)   End of Session Activity Tolerance: Patient tolerated treatment well General Behavior During Session: [facility] for tasks performed Cognition: [facility] for tasks performed   Yehuda Printup L. Noralee Stain, COTA/L  11/20/2011, 6:13 PM

## 2011-11-23 ENCOUNTER — Ambulatory Visit (HOSPITAL_COMMUNITY): Payer: BC Managed Care – PPO | Admitting: Occupational Therapy

## 2011-11-24 ENCOUNTER — Ambulatory Visit (HOSPITAL_COMMUNITY)
Admission: RE | Admit: 2011-11-24 | Discharge: 2011-11-24 | Disposition: A | Discharge status: Home / Self Care | Payer: BC Managed Care – PPO | Source: Ambulatory Visit | Attending: Pulmonary Disease | Admitting: Pulmonary Disease

## 2011-11-24 NOTE — Progress Notes (Signed)
Occupational Therapy Treatment  Patient Details  Name: Monica Lowery MRN: [id number] Date of Birth: 11-27-1971  Today's Date: 11/24/2011 Time: 4098-1191 Time Calculation (min): 56 min Manual Therapy [phone] 27' Therapeutic Exercises 436-505 29' Visit#: 9  of 16   Re-eval: 12/11/11    Subjective Symptoms/Limitations Symptoms: S:  I have been doing my exercises once a day at home. Pain Assessment Currently in Pain?: Yes Pain Score:   2 Pain Location: Shoulder Pain Orientation: Left Pain Type: Acute pain   Exercise/Treatments Supine Protraction: PROM;10 reps;AAROM;15 reps Horizontal ABduction: PROM;10 reps;AAROM;15 reps External Rotation: PROM;10 reps;AAROM;15 reps Internal Rotation: PROM;10 reps;AAROM;15 reps Flexion: PROM;10 reps;AAROM;15 reps ABduction: PROM;10 reps;AAROM;15 reps Seated Elevation: AROM;15 reps Extension: AROM;15 reps Retraction: AROM;15 reps Row: AROM;15 reps Pulleys Flexion: 2 minutes ABduction: 2 minutes Therapy Ball Flexion: 25 reps ABduction: 25 reps Right/Left: 5 reps Manual Therapy Manual Therapy: Myofascial release Myofascial Release: MFR and manual stretching to left uper arm, scapular, and shoulder region to decrease pain and incrase A/PROM to [facility] Rebounding x 3' [phone]  Occupational Therapy Assessment and Plan OT Assessment and Plan Clinical Impression Statement: A:  Good release with rebounding. OT Plan: P:  Increase PROM by 10 degrees.   Goals Short Term Goals Time to Complete Short Term Goals: 4 weeks Short Term Goal 1: Patient will be educated on HEP. Short Term Goal 2: Patient will increase left shoulder PROM to [facility] for increased ability to don deoderant and wash under her arm. 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 Short Term Goal 4: Patient will decrease pain to 2/10 with activity. Short Term Goal 5: Patient will decrease fascial  restrictions from max to mod in her left shoulder region. Long Term Goals Time to Complete Long Term Goals: 8 weeks Long Term Goal 1: Patient will complete all B/IADLs, work, and leisure activities with LUE. Long Term Goal 2: Patient will increase left shoulder AROM to WNL for increased ability to place items in overhead cabinets. 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 Long Term Goal 4: Patient will decrease pain to 1/10 in her left shoulder while working. Long Term Goal 5: Patient will decrease fasical restrictions to trace in her left shoulder region. End of Session Patient Active Problem List  Diagnoses  . Frozen shoulder  . Pain in joint, shoulder region  . Muscle weakness (generalized)   End of Session Activity Tolerance: Patient tolerated treatment well General Behavior During Session: [facility] for tasks performed Cognition: [facility] for tasks performed   Shirlean Mylar, OTR/L  11/24/2011, 4:58 PM

## 2011-11-25 ENCOUNTER — Ambulatory Visit (HOSPITAL_COMMUNITY)
Admission: RE | Admit: 2011-11-25 | Discharge: 2011-11-25 | Disposition: A | Discharge status: Home / Self Care | Payer: BC Managed Care – PPO | Source: Ambulatory Visit | Attending: Occupational Therapy | Admitting: Occupational Therapy

## 2011-11-25 DIAGNOSIS — M25519 Pain in unspecified shoulder: Secondary | ICD-10-CM

## 2011-11-25 DIAGNOSIS — M6281 Muscle weakness (generalized): Secondary | ICD-10-CM

## 2011-11-25 DIAGNOSIS — M7500 Adhesive capsulitis of unspecified shoulder: Secondary | ICD-10-CM

## 2011-11-25 NOTE — Progress Notes (Signed)
Occupational Therapy Treatment  Patient Details  Name: Monica Lowery MRN: [id number] Date of Birth: 10/09/1971  Today's Date: 11/25/2011 Time: 1478-2956 Time Calculation (min): 62 min Visit#: 10  of 16   Re-eval: 12/11/11 Manual Therapy [phone] 30' Therapeutic Exercise  528-545 54' IFES with moist heat.  [phone] 15'    Subjective Symptoms/Limitations Symptoms: S:  I feel pretty good, I have been really working on it. Pain Assessment Currently in Pain?: No/denies Exercise/Treatments Supine Protraction: PROM;10 reps Horizontal ABduction: PROM;10 reps External Rotation: PROM;10 reps Internal Rotation: PROM;10 reps Flexion: PROM;10 reps ABduction: PROM;10 reps Seated Elevation: AROM;15 reps Extension: AROM;15 reps Retraction: AROM;15 reps Row: AROM;15 reps Pulleys Flexion:  (time) ABduction:  (time) Therapy Ball Flexion: 15 reps ABduction: 15 reps Right/Left: 5 reps ROM / Strengthening / Isometric Strengthening UBE (Upper Arm Bike): 3' forward, 3' reverse 1.5 Wall Wash: time Thumb Tacks: time Prot/Ret//Elev/Dep: time  Modalities Modalities: Moist Heat Manual Therapy Manual Therapy: Myofascial release Myofascial Release: MFR and manual stretching to left upper arm, scapular and shoulder region to decrease pain and increase A/PROM.  Prone for rebounding x 3' Moist Heat Therapy Number Minutes Moist Heat: 15 Minutes Moist Heat Location: Shoulder Electrical Stimulation Electrical Stimulation Location: left shoulder Electrical Stimulation Action: IFES Electrical Stimulation Parameters: Hi/low sweet Electrical Stimulation Goals: Pain  Occupational Therapy Assessment and Plan OT Assessment and Plan Clinical Impression Statement: A:  Increase PROM today. Rehab Potential: Excellent OT Plan: P:  Resume missed ex.   Goals Short Term Goals Time to Complete Short Term Goals: 4 weeks Short Term Goal 1: Patient will be educated on HEP. Short Term Goal 2:  Patient will increase left shoulder PROM to [facility] for increased ability to don deoderant and wash under her arm. 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 Short Term Goal 4: Patient will decrease pain to 2/10 with activity. Short Term Goal 5: Patient will decrease fascial restrictions from max to mod in her left shoulder region. Long Term Goals Time to Complete Long Term Goals: 8 weeks Long Term Goal 1: Patient will complete all B/IADLs, work, and leisure activities with LUE. Long Term Goal 2: Patient will increase left shoulder AROM to WNL for increased ability to place items in overhead cabinets. 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 Long Term Goal 4: Patient will decrease pain to 1/10 in her left shoulder while working. Long Term Goal 5: Patient will decrease fasical restrictions to trace in her left shoulder region. End of Session Patient Active Problem List  Diagnoses  . Frozen shoulder  . Pain in joint, shoulder region  . Muscle weakness (generalized)   End of Session Activity Tolerance: Patient tolerated treatment well General Behavior During Session: [facility] for tasks performed Cognition: [facility] for tasks performed   [name], COTA/L  11/25/2011, 5:56 PM

## 2011-11-27 ENCOUNTER — Inpatient Hospital Stay (HOSPITAL_COMMUNITY)
Admission: RE | Admit: 2011-11-27 | Payer: BC Managed Care – PPO | Source: Ambulatory Visit | Admitting: Occupational Therapy

## 2011-11-27 ENCOUNTER — Telehealth (HOSPITAL_COMMUNITY): Payer: Self-pay | Admitting: Occupational Therapy

## 2011-12-07 ENCOUNTER — Ambulatory Visit (HOSPITAL_COMMUNITY)
Admission: RE | Admit: 2011-12-07 | Discharge: 2011-12-07 | Disposition: A | Discharge status: Home / Self Care | Payer: BC Managed Care – PPO | Source: Ambulatory Visit | Attending: Occupational Therapy | Admitting: Occupational Therapy

## 2011-12-07 DIAGNOSIS — M6281 Muscle weakness (generalized): Secondary | ICD-10-CM

## 2011-12-07 DIAGNOSIS — M25519 Pain in unspecified shoulder: Secondary | ICD-10-CM

## 2011-12-07 DIAGNOSIS — M7500 Adhesive capsulitis of unspecified shoulder: Secondary | ICD-10-CM

## 2011-12-07 NOTE — Progress Notes (Signed)
Occupational Therapy Treatment  Patient Details  Name: CHANEQUA SPEES MRN: [id number] Date of Birth: 04-14-1971  Today's Date: 12/07/2011 Time: 1015-1100 Time Calculation (min): 45 min Manual Therapy [id number] 30' Therapeutic Exercise 1045-1100 15' Visit#: 11  of 16   Re-eval: 12/11/11    Subjective Symptoms/Limitations Symptoms: "I feel better. I have been massaging it and trying to use it more." "My doctor says I scar more than other people" Pain Assessment Currently in Pain?: No/denies Pain Score: 0-No pain  Exercise/Treatments Supine Protraction: PROM;10 reps Horizontal ABduction: PROM;10 reps External Rotation: PROM;10 reps Internal Rotation: PROM;10 reps Flexion: PROM;10 reps ABduction: PROM;10 reps Seated Elevation: AROM;15 reps Extension: AROM;15 reps Therapy Ball Flexion: 15 reps ABduction: 15 reps Right/Left: 5 reps  Occupational Therapy Assessment and Plan OT Assessment and Plan Clinical Impression Statement: Patient able to relax a bit more today and has gained shoulder range of motion without increased pain. Rehab Potential: Excellent OT Treatment/Interventions: Therapeutic exercise;Manual therapy OT Plan: P: Continue plan of care.   Goals   End of Session Patient Active Problem List  Diagnoses  . Frozen shoulder  . Pain in joint, shoulder region  . Muscle weakness (generalized)   End of Session Activity Tolerance: Patient tolerated treatment well General Behavior During Session: [facility] for tasks performed Cognition: [facility] for tasks performed  Treatment, documentation and charges completed by D. Rubye Oaks OTR/L [place] Scrape, Bethany Helene 12/07/2011, 1:20 PM

## 2011-12-09 ENCOUNTER — Ambulatory Visit (HOSPITAL_COMMUNITY)
Admission: RE | Admit: 2011-12-09 | Discharge: 2011-12-09 | Disposition: A | Discharge status: Home / Self Care | Payer: BC Managed Care – PPO | Source: Ambulatory Visit | Attending: Orthopedic Surgery | Admitting: Orthopedic Surgery

## 2011-12-09 DIAGNOSIS — M25519 Pain in unspecified shoulder: Secondary | ICD-10-CM | POA: Insufficient documentation

## 2011-12-09 DIAGNOSIS — M25619 Stiffness of unspecified shoulder, not elsewhere classified: Secondary | ICD-10-CM | POA: Insufficient documentation

## 2011-12-09 DIAGNOSIS — IMO0001 Reserved for inherently not codable concepts without codable children: Secondary | ICD-10-CM | POA: Insufficient documentation

## 2011-12-09 DIAGNOSIS — M6281 Muscle weakness (generalized): Secondary | ICD-10-CM | POA: Insufficient documentation

## 2011-12-09 DIAGNOSIS — M7500 Adhesive capsulitis of unspecified shoulder: Secondary | ICD-10-CM

## 2011-12-09 NOTE — Progress Notes (Signed)
Occupational Therapy Treatment  Patient Details  Name: Monica Lowery MRN: [id number] Date of Birth: 02-07-1971  Today's Date: 12/09/2011 Time: 2595-6387 Time Calculation (min): 50 min  Visit#: 12  of 16   Re-eval: 12/11/11    Subjective    Precautions/Restrictions     Exercise/Treatments Supine Protraction: PROM;10 reps Horizontal ABduction: PROM;10 reps External Rotation: PROM;10 reps Internal Rotation: PROM;10 reps Flexion: PROM;10 reps ABduction: PROM;10 reps Seated Elevation: AROM;15 reps Extension: AROM;15 reps Retraction: AROM;15 reps Row: AROM;15 reps Therapy Ball Flexion: 15 reps ABduction: 15 reps    Manual Therapy Manual Therapy: Joint mobilization Joint Mobilization: Joint mobolization and MFR to LUE shoulder scapular region to decrease pain and increase A/PROM.  Occupational Therapy Assessment and Plan OT Assessment and Plan Clinical Impression Statement: Patient continues to learn to relax into stretches. She became nausious today when beginning exercises and during ball stretches. Rehab Potential: Excellent OT Frequency: Min 2X/week OT Duration: 8 weeks OT Treatment/Interventions: Therapeutic exercise;Manual therapy;Patient/family education OT Plan: P: Continue Plan of Care.   Goals    Problem List Patient Active Problem List  Diagnoses  . Frozen shoulder  . Pain in joint, shoulder region  . Muscle weakness (generalized)    End of Session Activity Tolerance: Patient tolerated treatment well General Behavior During Session: [facility] for tasks performed Cognition: [facility] for tasks performed   Lisa Roca OTR/L 12/09/2011, 12:04 PM

## 2011-12-10 ENCOUNTER — Ambulatory Visit (HOSPITAL_COMMUNITY): Payer: BC Managed Care – PPO | Admitting: Occupational Therapy

## 2011-12-14 ENCOUNTER — Ambulatory Visit (HOSPITAL_COMMUNITY)
Admission: RE | Admit: 2011-12-14 | Discharge: 2011-12-14 | Disposition: A | Discharge status: Home / Self Care | Payer: BC Managed Care – PPO | Source: Ambulatory Visit | Attending: Pulmonary Disease | Admitting: Pulmonary Disease

## 2011-12-14 DIAGNOSIS — M6281 Muscle weakness (generalized): Secondary | ICD-10-CM

## 2011-12-14 DIAGNOSIS — M25519 Pain in unspecified shoulder: Secondary | ICD-10-CM

## 2011-12-14 DIAGNOSIS — M7500 Adhesive capsulitis of unspecified shoulder: Secondary | ICD-10-CM

## 2011-12-14 NOTE — Progress Notes (Signed)
Occupational Therapy Treatment  Patient Details  Name: GEETIKA LABORDE MRN: [id number] Date of Birth: 1971-06-12  Today's Date: 12/14/2011 Time: 1191-4782 Time Calculation (min): 82 min  Visit#: 13  of 16   Re-eval: 12/11/11 Manual Therapy [phone] 31' Therapeutic Exercise 447-521 45' IFES with moist heat 522-537 15'    Subjective Symptoms/Limitations Symptoms: S:   It feels ok Pain Assessment Currently in Pain?: Yes Pain Score:   3 Pain Location: Shoulder Pain Orientation: Left Pain Type: Acute pain  Precautions/Restrictions     Exercise/Treatments Supine Protraction: PROM;10 reps;AROM;15 reps Horizontal ABduction: PROM;10 reps;AROM;15 reps External Rotation: PROM;10 reps;AROM;15 reps Internal Rotation: PROM;10 reps;AROM;15 reps Flexion: PROM;10 reps;AROM;15 reps ABduction: PROM;10 reps;AROM;15 reps Seated Elevation: Theraband;10 reps Theraband Level (Shoulder Elevation): Level 2 (Red) Extension: Theraband;10 reps Theraband Level (Shoulder Extension): Level 2 (Red) Retraction: Theraband;10 reps Theraband Level (Shoulder Retraction): Level 2 (Red) Row: Theraband;10 reps Theraband Level (Shoulder Row): Level 2 (Red) External Rotation: Theraband;10 reps Theraband Level (Shoulder External Rotation): Level 2 (Red) Internal Rotation: Theraband;10 reps Theraband Level (Shoulder Internal Rotation): Level 2 (Red) Therapy Ball Flexion: 20 reps ABduction: 20 reps Right/Left: 5 reps ROM / Strengthening / Isometric Strengthening UBE (Upper Arm Bike): 3' forward, 3' reverse 2.0 Wall Wash: time Thumb Tacks: time Prot/Ret//Elev/Dep: time    Manual Therapy Manual Therapy: Myofascial release Myofascial Release: MFR and manual stretching to left upper arm, scapular, and shoulder region to decrease pain and increase A/PROM to [facility]  Occupational Therapy Assessment and Plan OT Assessment and Plan Clinical Impression Statement: A:  Added corner stretch and tband for  scapular strengthening. Rehab Potential: Excellent OT Plan: P:  REASSESS   Goals Short Term Goals Time to Complete Short Term Goals: 4 weeks Short Term Goal 1: Patient will be educated on HEP. Short Term Goal 2: Patient will increase left shoulder PROM to [facility] for increased ability to don deoderant and wash under her arm. 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 Short Term Goal 4: Patient will decrease pain to 2/10 with activity. Short Term Goal 5: Patient will decrease fascial restrictions from max to mod in her left shoulder region. Long Term Goals Time to Complete Long Term Goals: 8 weeks Long Term Goal 1: Patient will complete all B/IADLs, work, and leisure activities with LUE. Long Term Goal 2: Patient will increase left shoulder AROM to WNL for increased ability to place items in overhead cabinets. 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 Long Term Goal 4: Patient will decrease pain to 1/10 in her left shoulder while working. Long Term Goal 5: Patient will decrease fasical restrictions to trace in her left shoulder region.  Problem List Patient Active Problem List  Diagnoses  . Frozen shoulder  . Pain in joint, shoulder region  . Muscle weakness (generalized)    End of Session Activity Tolerance: Patient tolerated treatment well General Behavior During Session: [facility] for tasks performed Cognition: [facility] for tasks performed   Tallon Gertz L. Lizandra Zakrzewski, COTA/L  12/14/2011, 5:31 PM

## 2011-12-16 ENCOUNTER — Ambulatory Visit (HOSPITAL_COMMUNITY)
Admission: RE | Admit: 2011-12-16 | Discharge: 2011-12-16 | Disposition: A | Discharge status: Home / Self Care | Payer: BC Managed Care – PPO | Source: Ambulatory Visit | Attending: Pulmonary Disease | Admitting: Pulmonary Disease

## 2011-12-16 DIAGNOSIS — M6281 Muscle weakness (generalized): Secondary | ICD-10-CM

## 2011-12-16 DIAGNOSIS — M7500 Adhesive capsulitis of unspecified shoulder: Secondary | ICD-10-CM

## 2011-12-16 DIAGNOSIS — M25519 Pain in unspecified shoulder: Secondary | ICD-10-CM

## 2011-12-16 NOTE — Progress Notes (Signed)
Occupational Therapy Treatment  Patient Details  Name: Monica Lowery MRN: [id number] Date of Birth: [birth year]  Today's Date: 12/16/2011 Time: 4696-2952 Time Calculation (min): 47 min  Visit#: 14  of 16   Re-eval: 12/11/11 Manual Therapy [phone] 10' Therapeutic Exercise [id number] 36'    Subjective Symptoms/Limitations Symptoms: S:  No pain just stiff. Pain Assessment Currently in Pain?: No/denies  Precautions/Restrictions     Exercise/Treatments Supine Protraction: PROM;10 reps;AROM;15 reps Horizontal ABduction: PROM;10 reps;AROM;15 reps External Rotation: PROM;10 reps;AROM;15 reps Internal Rotation: PROM;10 reps;AROM;15 reps Flexion: PROM;10 reps;AROM;15 reps ABduction: PROM;10 reps;AROM;15 reps Seated Extension: Theraband;12 reps Theraband Level (Shoulder Extension): Level 2 (Red) Retraction: Theraband;12 reps Theraband Level (Shoulder Retraction): Level 2 (Red) Row: Theraband;12 reps Theraband Level (Shoulder Row): Level 2 (Red) Protraction: AROM;12 reps Horizontal ABduction: AROM;12 reps External Rotation: AROM;Theraband;12 reps Theraband Level (Shoulder External Rotation): Level 2 (Red) Internal Rotation: AROM;Theraband;12 reps Theraband Level (Shoulder Internal Rotation): Level 2 (Red) Flexion: AROM;12 reps Abduction: AROM;12 reps Therapy Ball Flexion: 20 reps ABduction: 20 reps Right/Left: 5 reps ROM / Strengthening / Isometric Strengthening UBE (Upper Arm Bike): 3' forward, 3' reverse 2.5 Wall Wash: time Thumb Tacks: time Prot/Ret//Elev/Dep: time   Teacher, music: 5 reps;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  Occupational Therapy Assessment and Plan OT Assessment and Plan Clinical Impression Statement: A:  Pt with limited  time today, unable to get some ex completed and reassess completed. Rehab Potential: Excellent OT Plan: P:  REASSESS   Goals Short Term Goals Time to Complete Short Term Goals: 4 weeks Short Term Goal 1: Patient will be educated on HEP. Short Term Goal 2: Patient will increase left shoulder PROM to [facility] for increased ability to don deoderant and wash under her arm. 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 Short Term Goal 4: Patient will decrease pain to 2/10 with activity. Short Term Goal 5: Patient will decrease fascial restrictions from max to mod in her left shoulder region. Long Term Goals Time to Complete Long Term Goals: 8 weeks Long Term Goal 1: Patient will complete all B/IADLs, work, and leisure activities with LUE. Long Term Goal 2: Patient will increase left shoulder AROM to WNL for increased ability to place items in overhead cabinets. 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 Long Term Goal 4: Patient will decrease pain to 1/10 in her left shoulder while working. Long Term Goal 5: Patient will decrease fasical restrictions to trace in her left shoulder region.  Problem List Patient Active Problem List  Diagnoses  . Frozen shoulder  . Pain in joint, shoulder region  . Muscle weakness (generalized)    End of Session Activity Tolerance: Patient tolerated treatment well General Behavior During Session: [facility] for tasks performed Cognition: [facility] for tasks performed   Shalamar Crays L. Ashelynn Marks, COTA/L  12/16/2011, 5:17 PM

## 2011-12-18 ENCOUNTER — Ambulatory Visit (HOSPITAL_COMMUNITY)
Admission: RE | Admit: 2011-12-18 | Discharge: 2011-12-18 | Disposition: A | Discharge status: Home / Self Care | Payer: BC Managed Care – PPO | Source: Ambulatory Visit | Attending: Pulmonary Disease | Admitting: Pulmonary Disease

## 2011-12-18 DIAGNOSIS — M25519 Pain in unspecified shoulder: Secondary | ICD-10-CM

## 2011-12-18 DIAGNOSIS — M6281 Muscle weakness (generalized): Secondary | ICD-10-CM

## 2011-12-18 DIAGNOSIS — M7500 Adhesive capsulitis of unspecified shoulder: Secondary | ICD-10-CM

## 2011-12-18 NOTE — Progress Notes (Signed)
Occupational Therapy Treatment  Patient Details  Name: DESHANTE CASSELL MRN: [id number] Date of Birth: 17-Apr-1971  Today's Date: 12/18/2011 Time: 4098-1191 Time Calculation (min): 28 min  Visit#: 14  of 16   Re-eval: 01/15/12 Reassess [phone] 10' Therapeutic Exercise [id number] 17'    Subjective Symptoms/Limitations Symptoms: S:  I don't feel well today, my blood sugar dropped and I have a cold. Pain Assessment Currently in Pain?: No/denies  Precautions/Restrictions     Exercise/Treatments    Seated Extension: Theraband;12 reps Theraband Level (Shoulder Extension): Level 3 (Green) Retraction: Theraband;12 reps Theraband Level (Shoulder Retraction): Level 3 (Green) Row: Theraband;12 reps Theraband Level (Shoulder Row): Level 3 (Green) External Rotation: Theraband;12 reps Theraband Level (Shoulder External Rotation): Level 3 (Green) Internal Rotation: Theraband;12 reps Theraband Level (Shoulder Internal Rotation): Level 3 (Green) Therapy Ball Flexion: 20 reps ABduction: 20 reps Right/Left: 5 reps ROM / Strengthening / Isometric Strengthening Wall Wash: 3' Thumb Tacks: 1' Prot/Ret//Elev/Dep: 1'   Stretches Corner Stretch: 5 reps;10 seconds       MFR held today per patient request, not feeling well.  Occupational Therapy Assessment and Plan OT Assessment and Plan Clinical Impression Statement: A:  See progress note Rehab Potential: Excellent OT Plan: P :  Continue 3 x's a week for 4 weeks.   Goals Short Term Goals Time to Complete Short Term Goals: 4 weeks Short Term Goal 1: Patient will be educated on HEP. Short Term Goal 1 Progress: Met Short Term Goal 2: Patient will increase left shoulder PROM to [employer] for increased ability to don deoderant and wash under her arm. Short Term Goal 2 Progress: Progressing toward goal Short Term Goal 3: Patient will increase left shoulder strength to 3+/5 for increased ability to restrain students at work. Short Term  Goal 3 Progress: Progressing toward goal Short Term Goal 4: Patient will decrease pain to 2/10 with activity. Short Term Goal 4 Progress: Progressing toward goal Short Term Goal 5: Patient will decrease fascial restrictions from max to mod in her left shoulder region. Short Term Goal 5 Progress: Progressing toward goal Long Term Goals Time to Complete Long Term Goals: 8 weeks Long Term Goal 1: Patient will complete all B/IADLs, work, and leisure activities with LUE. Long Term Goal 1 Progress: Progressing toward goal Long Term Goal 2: Patient will increase left shoulder AROM to WNL for increased ability to place items in overhead cabinets. 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 Long Term Goal 3 Progress: Progressing toward goal Long Term Goal 4: Patient will decrease pain to 1/10 in her left shoulder while working. Long Term Goal 4 Progress: Progressing toward goal Long Term Goal 5: Patient will decrease fasical restrictions to trace in her left shoulder region. Long Term Goal 5 Progress: Progressing toward goal  Problem List Patient Active Problem List  Diagnoses  . Frozen shoulder  . Pain in joint, shoulder region  . Muscle weakness (generalized)    End of Session Activity Tolerance: Patient tolerated treatment well General Behavior During Session: [facility] for tasks performed Cognition: [facility] for tasks performed   Anola Mcgough L. Ednah Hammock, COTA/L  12/18/2011, 4:52 PM

## 2011-12-21 ENCOUNTER — Ambulatory Visit (HOSPITAL_COMMUNITY): Payer: BC Managed Care – PPO | Admitting: Occupational Therapy

## 2011-12-21 ENCOUNTER — Ambulatory Visit (HOSPITAL_COMMUNITY): Payer: BC Managed Care – PPO | Admitting: Specialist

## 2011-12-23 ENCOUNTER — Ambulatory Visit (HOSPITAL_COMMUNITY)
Admission: RE | Admit: 2011-12-23 | Discharge: 2011-12-23 | Disposition: A | Discharge status: Home / Self Care | Payer: BC Managed Care – PPO | Source: Ambulatory Visit | Attending: Pulmonary Disease | Admitting: Pulmonary Disease

## 2011-12-23 DIAGNOSIS — M7500 Adhesive capsulitis of unspecified shoulder: Secondary | ICD-10-CM

## 2011-12-23 DIAGNOSIS — M25519 Pain in unspecified shoulder: Secondary | ICD-10-CM

## 2011-12-23 DIAGNOSIS — M6281 Muscle weakness (generalized): Secondary | ICD-10-CM

## 2011-12-23 NOTE — Progress Notes (Signed)
Occupational Therapy Treatment  Patient Details  Name: Monica Lowery MRN: [id number] Date of Birth: June 21, 1971  Today's Date: 12/23/2011 Time: 7829-5621 Time Calculation (min): 56 min  Visit#: 15  of 28   Re-eval: 01/15/12 Manual Therapy [phone] 24 Therapeutic Exercise [id number] 31'    Subjective Symptoms/Limitations Symptoms: S:  School was good today Pain Assessment Currently in Pain?: No/denies  Precautions/Restrictions     Exercise/Treatments Supine Protraction: PROM;Strengthening;10 reps Protraction Weight (lbs): 1# Horizontal ABduction: PROM;Strengthening;10 reps Horizontal ABduction Weight (lbs): 1# External Rotation: PROM;Strengthening;10 reps External Rotation Weight (lbs): 1# Internal Rotation: PROM;Strengthening;10 reps Internal Rotation Weight (lbs): 1# Flexion: PROM;Strengthening;10 reps Shoulder Flexion Weight (lbs): 1# ABduction: PROM;Strengthening;10 reps Shoulder ABduction Weight (lbs): 1# Seated Protraction: AROM;15 reps Horizontal ABduction: AROM;15 reps External Rotation: AROM;15 reps Internal Rotation: AROM;15 reps Flexion: AROM;15 reps Abduction: AROM;15 reps Therapy Ball Flexion: 20 reps ABduction: 20 reps Right/Left: 5 reps ROM / Strengthening / Isometric Strengthening UBE (Upper Arm Bike): 3' forward, 3' reverse 3.0 Wall Wash: 4' Thumb Tacks: 1' Prot/Ret//Elev/Dep: 1'      Manual Therapy Manual Therapy: Myofascial release Myofascial Release: MFR and manual stretching to left upper arm, scapular, and shoulder region to decrease pain and increase A/PROM to [facility].   Occupational Therapy Assessment and Plan OT Assessment and Plan Clinical Impression Statement: A:  Increase tolerance to PROM and decrease c/o being sore. Rehab Potential: Excellent OT Plan: P:  Resume bands and add IR/ER stretch.   Goals Short Term Goals Time to Complete Short Term Goals: 4 weeks Short Term Goal 1: Patient will be educated on HEP. Short Term  Goal 2: Patient will increase left shoulder PROM to [facility] for increased ability to don deoderant and wash under her arm. 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 Short Term Goal 4: Patient will decrease pain to 2/10 with activity. Short Term Goal 5: Patient will decrease fascial restrictions from max to mod in her left shoulder region. Long Term Goals Time to Complete Long Term Goals: 8 weeks Long Term Goal 1: Patient will complete all B/IADLs, work, and leisure activities with LUE. Long Term Goal 2: Patient will increase left shoulder AROM to WNL for increased ability to place items in overhead cabinets. 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 Long Term Goal 4: Patient will decrease pain to 1/10 in her left shoulder while working. Long Term Goal 5: Patient will decrease fasical restrictions to trace in her left shoulder region.  Problem List Patient Active Problem List  Diagnoses  . Frozen shoulder  . Pain in joint, shoulder region  . Muscle weakness (generalized)    End of Session Activity Tolerance: Patient tolerated treatment well General Behavior During Session: [facility] for tasks performed Cognition: [facility] for tasks performed   TRUE Garciamartinez L. Lesia Monica, COTA/L  12/23/2011, 5:11 PM

## 2011-12-25 ENCOUNTER — Ambulatory Visit (HOSPITAL_COMMUNITY): Payer: BC Managed Care – PPO | Admitting: Occupational Therapy

## 2011-12-27 IMAGING — US US ABDOMEN COMPLETE
1 series · 13 of 25 positions shown · non-contrast
Comparison: 08/12/2007 MR of the abdomen and 06/08/2007 CT of the
abdomen.

CLINICAL DATA: Abdominal pain.  History of hepatic masses.

COMPLETE ABDOMINAL ULTRASOUND

[Series 1: us abdomen complete · 0.28mm/px · 13 of 92 slices shown]
[im 1/92]
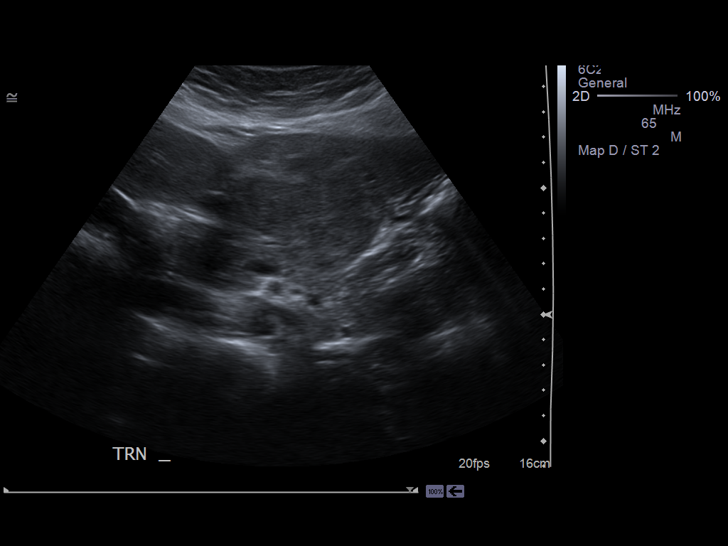
[im 8/92]
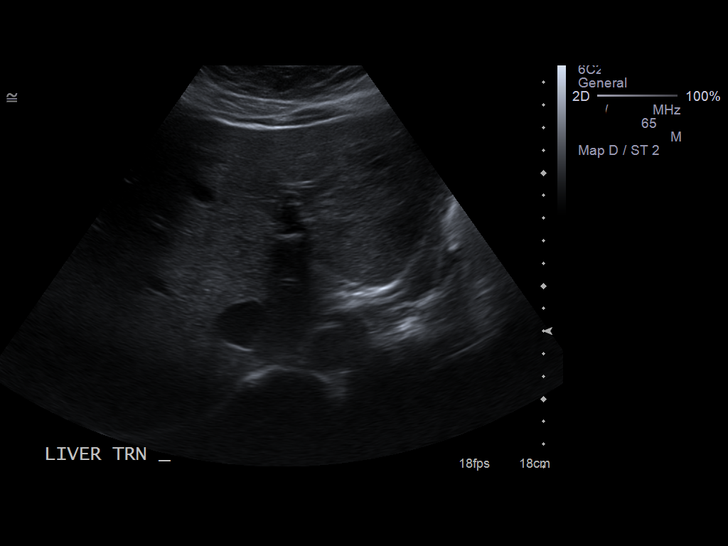
[im 16/92]
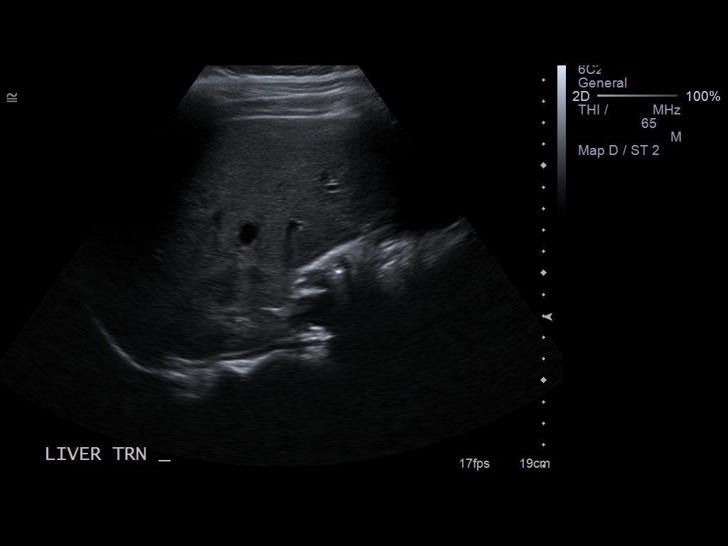
[im 23/92]
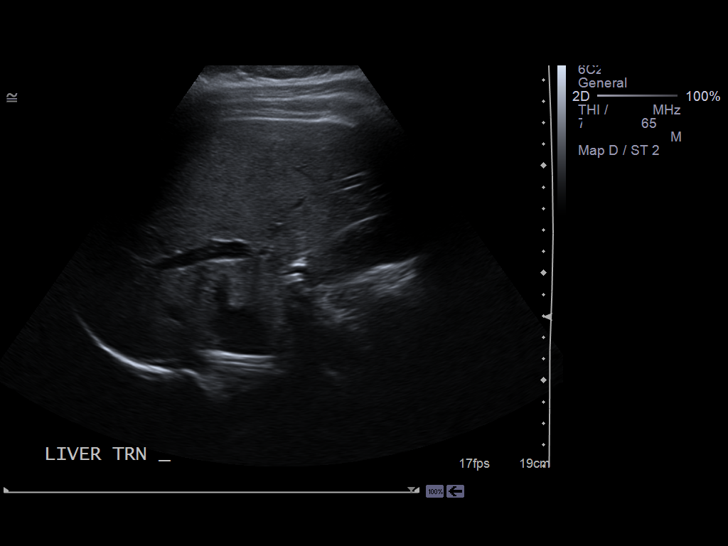
[im 31/92]
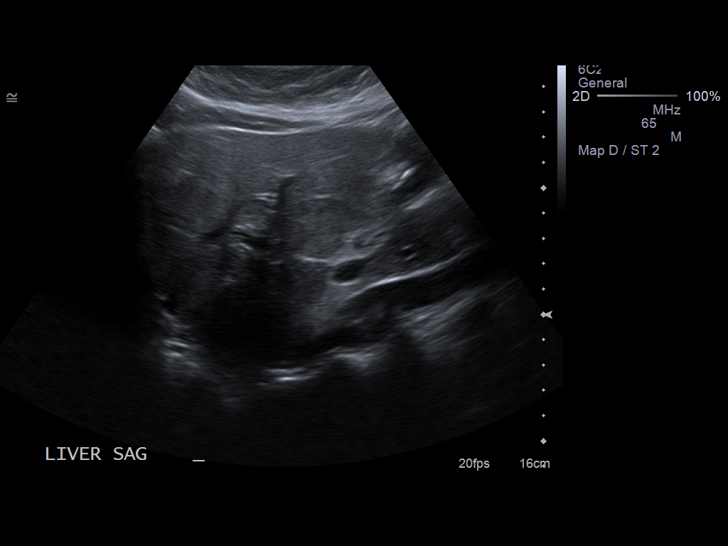
[im 38/92]
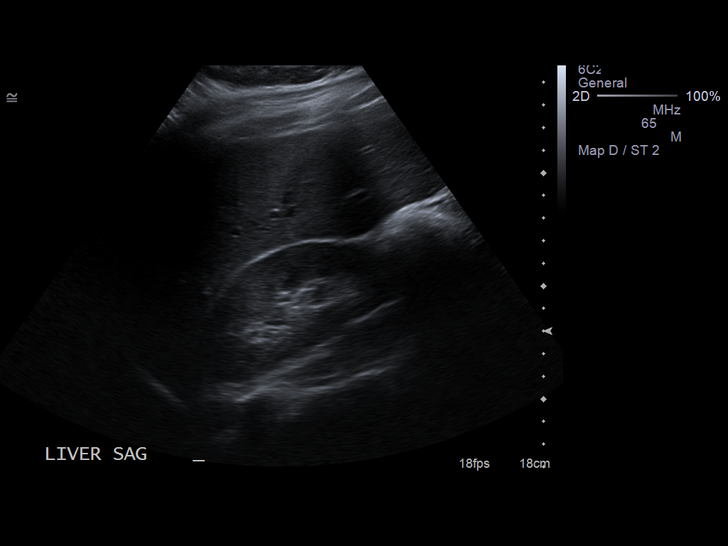
[im 46/92]
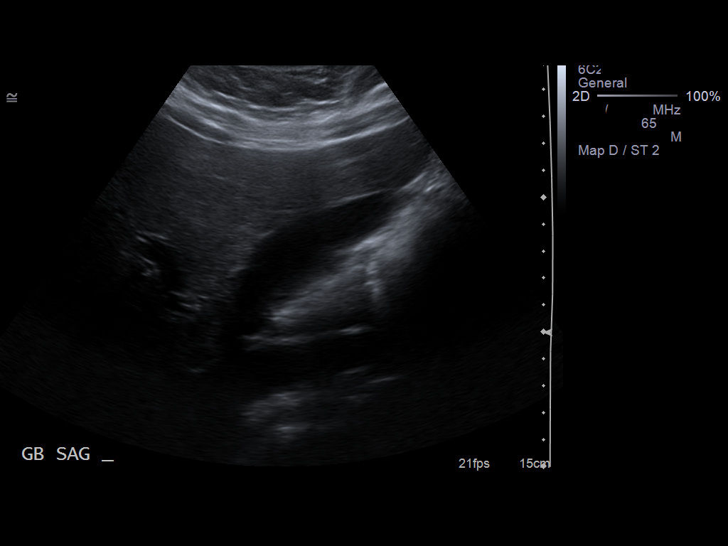
[im 54/92]
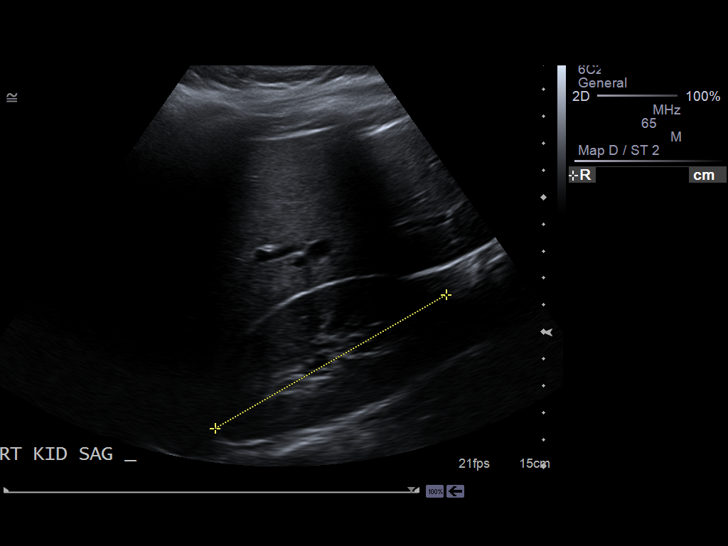
[im 61/92]
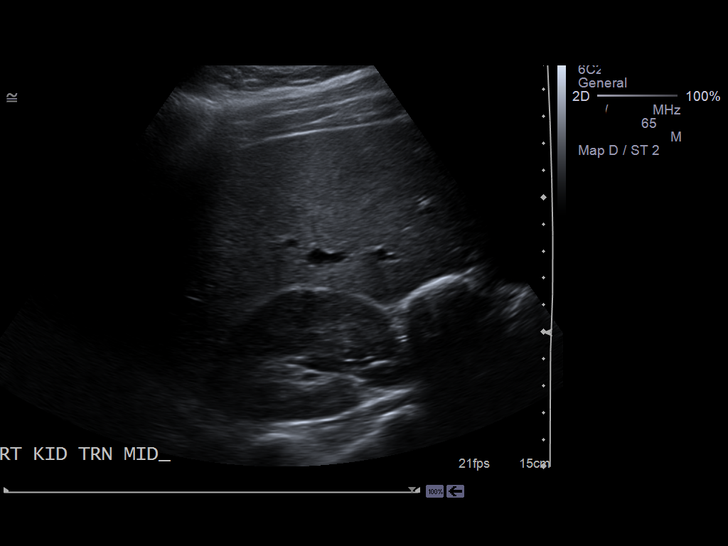
[im 69/92]
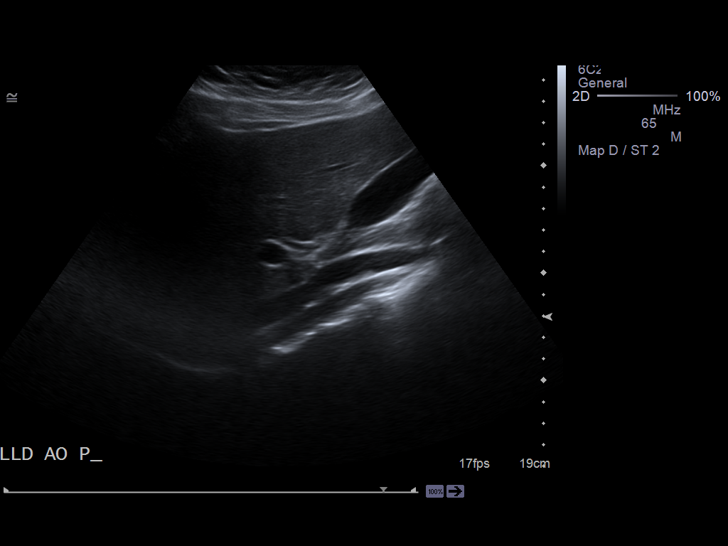
[im 76/92]
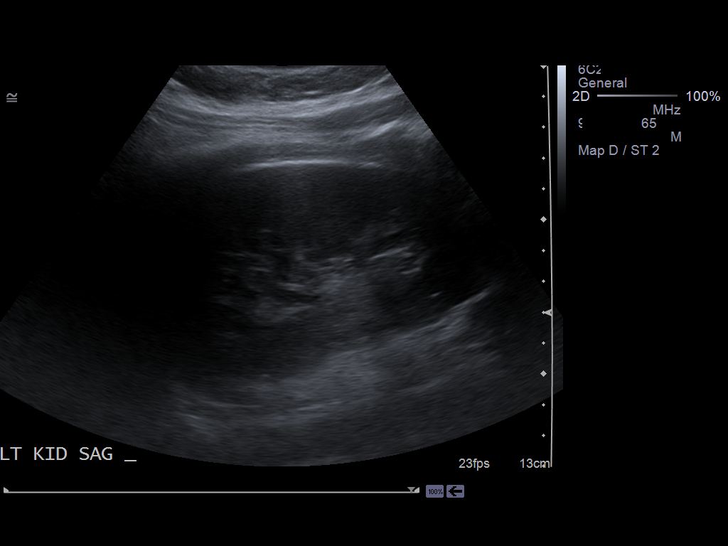
[im 84/92]
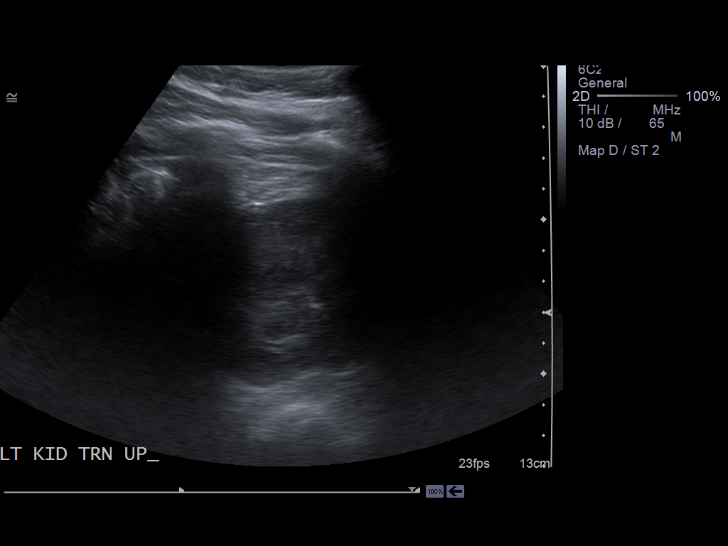
[im 92/92]
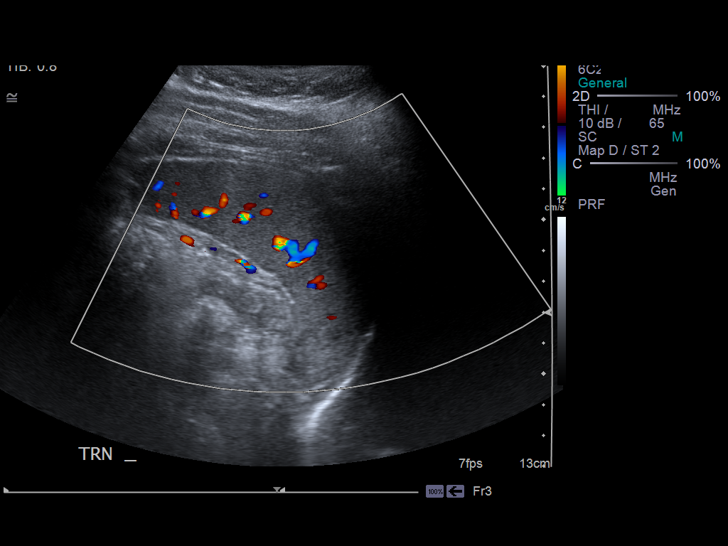

[13 of 25 positions shown; findings below may reference images not displayed]

FINDINGS: Gallbladder:  No gallstones, gallbladder wall thickening, or
pericholecystic fluid.

Common bile duct:  Common bile duct is normal in caliber measuring
2-3 mm in diameter.

Liver:  There is a large solid mass located within the posterior
left lobe of the liver.  By ultrasound this measures 9.7 x 4.7 x
6.4 cm in size.  There is no central stellate area or scar visible
by ultrasound. This appears increased in size when compared with
the previous MR and CT scan.  The lesion more superiorly located
within the left lobe of the liver and the lesion more laterally
located within the right lobe of the liver are not definitely
visualized on today's ultrasound evaluation.  Recommend repeat CT
scan to more accurately determine any change in the size of the
lesions.  There is no intrahepatic  biliary dilatation.

IVC:  Appears normal.

Pancreas:  No focal abnormality seen.

Spleen:  Normal appearance measuring 5.8 cm in length.

Right Kidney:  10.4 cm in length.  No hydronephrosis.

Left Kidney:  11.9 cm in length.  No hydronephrosis.

Abdominal aorta:  No aneurysm identified.
IMPRESSION: 1.Large (9.7 cm) solid mass associated with the left lobe of the
liver.  This appears increased in size.  Recommend repeat CT scan
to directly compare current size of the lesion with the previous CT
dated 06/08/2007.  Also, the additional lesions seen by CT scan are
not well visualized on the current ultrasound and would be better
evaluated by CT.
2.  Normal-appearing gallbladder and common bile duct.

## 2011-12-30 ENCOUNTER — Inpatient Hospital Stay (HOSPITAL_COMMUNITY)
Admission: RE | Admit: 2011-12-30 | Payer: BC Managed Care – PPO | Source: Ambulatory Visit | Admitting: Occupational Therapy

## 2012-01-04 ENCOUNTER — Ambulatory Visit (HOSPITAL_COMMUNITY)
Admission: RE | Admit: 2012-01-04 | Discharge: 2012-01-04 | Disposition: A | Discharge status: Home / Self Care | Payer: BC Managed Care – PPO | Source: Ambulatory Visit | Attending: Pulmonary Disease | Admitting: Pulmonary Disease

## 2012-01-04 DIAGNOSIS — M25519 Pain in unspecified shoulder: Secondary | ICD-10-CM

## 2012-01-04 DIAGNOSIS — M7500 Adhesive capsulitis of unspecified shoulder: Secondary | ICD-10-CM

## 2012-01-04 DIAGNOSIS — M6281 Muscle weakness (generalized): Secondary | ICD-10-CM

## 2012-01-04 NOTE — Progress Notes (Signed)
Occupational Therapy Treatment  Patient Details  Name: Monica Lowery MRN: [id number] Date of Birth: 03-21-1971  Today's Date: 01/04/2012 Time: 7829-5621 Time Calculation (min): 47 min  Visit#: 15  of 28   Re-eval: 01/15/12 Manual Therapy [phone] 17' Therapeutic Exercise [id number] 29'    Subjective Symptoms/Limitations Symptoms: S: My doctor had to reschedule, I have not been yet.  Precautions/Restrictions     Exercise/Treatments Supine Protraction: PROM;Strengthening;15 reps Protraction Weight (lbs): 1# Horizontal ABduction: PROM;Strengthening;15 reps Horizontal ABduction Weight (lbs): 1# External Rotation: PROM;Strengthening;15 reps External Rotation Weight (lbs): 1# Internal Rotation: PROM;Strengthening;15 reps Internal Rotation Weight (lbs): 1# Flexion: PROM;Strengthening;15 reps Shoulder Flexion Weight (lbs): 1# ABduction: PROM;Strengthening;15 reps Shoulder ABduction Weight (lbs): 1# Other Supine Exercises: ER stretch with 4# x 2 min Seated Extension: Theraband;15 reps Theraband Level (Shoulder Extension): Level 3 (Green) Retraction: Theraband;15 reps Theraband Level (Shoulder Retraction): Level 3 (Green) Row: Theraband;15 reps Theraband Level (Shoulder Row): Level 3 (Green) External Rotation: Theraband;15 reps Theraband Level (Shoulder External Rotation): Level 3 (Green) Internal Rotation: Theraband;15 reps Theraband Level (Shoulder Internal Rotation): Level 3 (Green) Therapy Ball Flexion: 20 reps ABduction: 20 reps Right/Left: 5 reps ROM / Strengthening / Isometric Strengthening UBE (Upper Arm Bike): 3' forward, 3' reverse 3.0 Wall Wash: time Thumb Tacks: time Prot/Ret//Elev/Dep: 1'       Manual Therapy Manual Therapy: Myofascial release Myofascial Release: MFR and manual stretching to left upper arm, scapular, and shoulder region to decrease pain and increase A/PROM to [facility]  Occupational Therapy Assessment and Plan OT Assessment and  Plan Clinical Impression Statement: A:  Very restricted and limited PROM today.  Added ER stretch with weight while supine. Rehab Potential: Good OT Plan: P   Goals Short Term Goals Time to Complete Short Term Goals: 4 weeks Short Term Goal 1: Patient will be educated on HEP. Short Term Goal 2: Patient will increase left shoulder PROM to [facility] for increased ability to don deoderant and wash under her arm. 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 Short Term Goal 4: Patient will decrease pain to 2/10 with activity. Short Term Goal 5: Patient will decrease fascial restrictions from max to mod in her left shoulder region. Long Term Goals Time to Complete Long Term Goals: 8 weeks Long Term Goal 1: Patient will complete all B/IADLs, work, and leisure activities with LUE. Long Term Goal 2: Patient will increase left shoulder AROM to WNL for increased ability to place items in overhead cabinets. 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 Long Term Goal 4: Patient will decrease pain to 1/10 in her left shoulder while working. Long Term Goal 5: Patient will decrease fasical restrictions to trace in her left shoulder region.  Problem List Patient Active Problem List  Diagnoses  . Frozen shoulder  . Pain in joint, shoulder region  . Muscle weakness (generalized)    End of Session Activity Tolerance: Patient tolerated treatment well General Behavior During Session: [facility] for tasks performed Cognition: [facility] for tasks performed  Montrell Cessna L. Noralee Stain, COTA/L  01/04/2012, 6:04 PM

## 2012-01-06 ENCOUNTER — Ambulatory Visit (HOSPITAL_COMMUNITY)
Admission: RE | Admit: 2012-01-06 | Discharge: 2012-01-06 | Disposition: A | Discharge status: Home / Self Care | Payer: BC Managed Care – PPO | Source: Ambulatory Visit | Attending: Pulmonary Disease | Admitting: Pulmonary Disease

## 2012-01-06 DIAGNOSIS — M6281 Muscle weakness (generalized): Secondary | ICD-10-CM

## 2012-01-06 DIAGNOSIS — M7500 Adhesive capsulitis of unspecified shoulder: Secondary | ICD-10-CM

## 2012-01-06 DIAGNOSIS — M25519 Pain in unspecified shoulder: Secondary | ICD-10-CM

## 2012-01-06 NOTE — Progress Notes (Signed)
Occupational Therapy Treatment  Patient Details  Name: Monica Lowery MRN: [id number] Date of Birth: 1971-08-12  Today's Date: 01/06/2012 Time: 1191-4782 Time Calculation (min): 54 min  Visit#: 15  of 28   Re-eval: 01/20/12 Manual Therapy [phone] 23' Therapeutic Exercise [phone] 30'    Subjective Symptoms/Limitations Symptoms: S:  I can reach behind my head some.  Precautions/Restrictions     Exercise/Treatments Supine Protraction: PROM;AROM;10 reps Protraction Weight (lbs): 2# Horizontal ABduction: PROM;AROM;10 reps Horizontal ABduction Weight (lbs): 2# External Rotation: PROM;AROM;10 reps External Rotation Weight (lbs): 2# Internal Rotation: PROM;AROM;10 reps Internal Rotation Weight (lbs): 2# Flexion: PROM;AROM;10 reps Shoulder Flexion Weight (lbs): 2# ABduction: PROM;AROM;10 reps Shoulder ABduction Weight (lbs): 2# Other Supine Exercises: ER stretch with 4# x 2 min Seated Extension: Theraband;15 reps Theraband Level (Shoulder Extension): Level 3 (Green) Retraction: Theraband;15 reps Theraband Level (Shoulder Retraction): Level 3 (Green) Row: Theraband;15 reps Theraband Level (Shoulder Row): Level 3 (Green) External Rotation: Theraband;15 reps Theraband Level (Shoulder External Rotation): Level 3 (Green) Internal Rotation: Theraband;15 reps Theraband Level (Shoulder Internal Rotation): Level 3 (Green) Therapy Ball Flexion: 20 reps ABduction: 20 reps Right/Left: 5 reps ROM / Strengthening / Isometric Strengthening UBE (Upper Arm Bike): 3' forward, 3' reverse 3.0 Wall Wash: time Thumb Tacks: time     Manual Therapy Manual Therapy: Myofascial release Myofascial Release: MFR and manual stretching to left upper arm, scapular, and shoulder region to decrease pain and increase A/PROM to [facility].   Occupational Therapy Assessment and Plan OT Assessment and Plan Clinical Impression Statement: A:  Increased PROM and tolerance to PROM.  Patient stated she can  now reach behind her neck to fasten a necklace. Rehab Potential: Good OT Plan: P:  Continue to increase PROM as patient tolerates.   Goals Short Term Goals Time to Complete Short Term Goals: 4 weeks Short Term Goal 1: Patient will be educated on HEP. Short Term Goal 1 Progress: Met Short Term Goal 2: Patient will increase left shoulder PROM to [facility] for increased ability to don deoderant and wash under her arm. Short Term Goal 2 Progress: Met Short Term Goal 3: Patient will increase left shoulder strength to 3+/5 for increased ability to restrain students at work. Short Term Goal 3 Progress: Progressing toward goal Short Term Goal 4: Patient will decrease pain to 2/10 with activity. Short Term Goal 4 Progress: Met Short Term Goal 5: Patient will decrease fascial restrictions from max to mod in her left shoulder region. Short Term Goal 5 Progress: Progressing toward goal Long Term Goals Time to Complete Long Term Goals: 8 weeks Long Term Goal 1: Patient will complete all B/IADLs, work, and leisure activities with LUE. Long Term Goal 2: Patient will increase left shoulder AROM to WNL for increased ability to place items in overhead cabinets. 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 Long Term Goal 4: Patient will decrease pain to 1/10 in her left shoulder while working. Long Term Goal 5: Patient will decrease fasical restrictions to trace in her left shoulder region.  Problem List Patient Active Problem List  Diagnoses  . Frozen shoulder  . Pain in joint, shoulder region  . Muscle weakness (generalized)    End of Session Activity Tolerance: Patient tolerated treatment well General Behavior During Session: [facility] for tasks performed Cognition: [facility] for tasks performed   Terrina Docter L. Romualdo Prosise, COTA/L  01/06/2012, 5:51 PM

## 2012-01-08 ENCOUNTER — Ambulatory Visit (HOSPITAL_COMMUNITY)
Admission: RE | Admit: 2012-01-08 | Discharge: 2012-01-08 | Disposition: A | Discharge status: Home / Self Care | Payer: BC Managed Care – PPO | Source: Ambulatory Visit | Attending: Orthopedic Surgery | Admitting: Orthopedic Surgery

## 2012-01-08 DIAGNOSIS — M25519 Pain in unspecified shoulder: Secondary | ICD-10-CM | POA: Insufficient documentation

## 2012-01-08 DIAGNOSIS — M6281 Muscle weakness (generalized): Secondary | ICD-10-CM | POA: Insufficient documentation

## 2012-01-08 DIAGNOSIS — M7500 Adhesive capsulitis of unspecified shoulder: Secondary | ICD-10-CM

## 2012-01-08 DIAGNOSIS — M25619 Stiffness of unspecified shoulder, not elsewhere classified: Secondary | ICD-10-CM | POA: Insufficient documentation

## 2012-01-08 DIAGNOSIS — IMO0001 Reserved for inherently not codable concepts without codable children: Secondary | ICD-10-CM | POA: Insufficient documentation

## 2012-01-08 NOTE — Progress Notes (Signed)
Occupational Therapy Treatment  Patient Details  Name: Monica Lowery MRN: [id number] Date of Birth: February 10, 1971  Today's Date: 01/08/2012 Time: 7829-5621 Time Calculation (min): 47 min  Visit#: 16  of 28   Re-eval: 01/20/12 Manual Therapy [phone] 22' Therapeutic Exercise [id number]     Subjective Symptoms/Limitations Symptoms: S:  The schedule for my doctor appointment was messed up. Pain Assessment Currently in Pain?: No/denies Pain Location: Shoulder Pain Orientation: Left Pain Type: Acute pain  Precautions/Restrictions     Exercise/Treatments Supine Protraction: PROM;10 reps;Strengthening;12 reps Protraction Weight (lbs): 2# Horizontal ABduction: PROM;10 reps;Strengthening;12 reps Horizontal ABduction Weight (lbs): 2# External Rotation: PROM;10 reps;Strengthening;12 reps External Rotation Weight (lbs): 2# Internal Rotation: PROM;10 reps;Strengthening;12 reps Internal Rotation Weight (lbs): 2# Flexion: PROM;10 reps;Strengthening;12 reps Shoulder Flexion Weight (lbs): 2# ABduction: PROM;10 reps;Strengthening;12 reps Shoulder ABduction Weight (lbs): 2# Seated Extension: Theraband;15 reps Theraband Level (Shoulder Extension): Level 3 (Green) Retraction: Theraband;15 reps Theraband Level (Shoulder Retraction): Level 3 (Green) Row: Theraband;15 reps Theraband Level (Shoulder Row): Level 3 (Green) External Rotation: Theraband;15 reps Theraband Level (Shoulder External Rotation): Level 3 (Green) Internal Rotation: Theraband;15 reps Theraband Level (Shoulder Internal Rotation): Level 3 (Green) Therapy Ball Flexion: 20 reps ABduction: 20 reps Right/Left: 5 reps ROM / Strengthening / Isometric Strengthening UBE (Upper Arm Bike): 3' forward, 3' reverse 3.0 Wall Wash: 3' Thumb Tacks: 1' Prot/Ret//Elev/Dep: 1'      Manual Therapy Manual Therapy: Myofascial release Myofascial Release: MFR and manual stretching to left upper arm, scapular, and shoulder region to  decrease pain and increase A/PROM to [facility].   Occupational Therapy Assessment and Plan OT Assessment and Plan Clinical Impression Statement: A; Continues with increased PROM and decreased pain with PROM Rehab Potential: Good OT Plan: P:  Add AROM seated.   Goals Short Term Goals Time to Complete Short Term Goals: 4 weeks Short Term Goal 1: Patient will be educated on HEP. Short Term Goal 2: Patient will increase left shoulder PROM to [facility] for increased ability to don deoderant and wash under her arm. 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 Short Term Goal 4: Patient will decrease pain to 2/10 with activity. Short Term Goal 5: Patient will decrease fascial restrictions from max to mod in her left shoulder region. Long Term Goals Time to Complete Long Term Goals: 8 weeks Long Term Goal 1: Patient will complete all B/IADLs, work, and leisure activities with LUE. Long Term Goal 2: Patient will increase left shoulder AROM to WNL for increased ability to place items in overhead cabinets. 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 Long Term Goal 4: Patient will decrease pain to 1/10 in her left shoulder while working. Long Term Goal 5: Patient will decrease fasical restrictions to trace in her left shoulder region.  Problem List Patient Active Problem List  Diagnoses  . Frozen shoulder  . Pain in joint, shoulder region  . Muscle weakness (generalized)    End of Session Activity Tolerance: Patient tolerated treatment well General Behavior During Session: [facility] for tasks performed Cognition: [facility] for tasks performed  Perri Lamagna L. Noralee Stain, COTA/L  01/08/2012, 5:39 PM

## 2012-01-11 ENCOUNTER — Ambulatory Visit (HOSPITAL_COMMUNITY)
Admission: RE | Admit: 2012-01-11 | Discharge: 2012-01-11 | Disposition: A | Discharge status: Home / Self Care | Payer: BC Managed Care – PPO | Source: Ambulatory Visit | Attending: Pulmonary Disease | Admitting: Pulmonary Disease

## 2012-01-11 DIAGNOSIS — M25519 Pain in unspecified shoulder: Secondary | ICD-10-CM

## 2012-01-11 DIAGNOSIS — M7500 Adhesive capsulitis of unspecified shoulder: Secondary | ICD-10-CM

## 2012-01-11 DIAGNOSIS — M6281 Muscle weakness (generalized): Secondary | ICD-10-CM

## 2012-01-11 NOTE — Progress Notes (Signed)
Occupational Therapy Treatment  Patient Details  Name: Monica Lowery MRN: [id number] Date of Birth: 1971/09/07  Today's Date: 01/11/2012 Time: 4098-1191 Time Calculation (min): 49 min  Visit#: 16  of 28   Re-eval: 01/20/12 Manual Therapy [phone] 20' Therapeutic Exercise [id number] 19' Measure for JAS splint 455-504 9'    Subjective Symptoms/Limitations Symptoms: S:  I used my arm to take off my coat, it is doing better.  Precautions/Restrictions     Exercise/Treatments Supine Protraction: PROM;Strengthening;10 reps Protraction Weight (lbs): 3# Horizontal ABduction: PROM;Strengthening;10 reps Horizontal ABduction Weight (lbs): 3# External Rotation: PROM;Strengthening;10 reps External Rotation Weight (lbs): 3# Internal Rotation: PROM;Strengthening;10 reps Internal Rotation Weight (lbs): 3# Flexion: PROM;Strengthening;10 reps Shoulder Flexion Weight (lbs): #3 ABduction: PROM;Strengthening;10 reps Shoulder ABduction Weight (lbs): 3# Seated Protraction: Strengthening;10 reps;Weights Protraction Weight (lbs): 2# Horizontal ABduction: Strengthening;10 reps;Weights Horizontal ABduction Weight (lbs): 3# External Rotation: Strengthening;10 reps;Weights External Rotation Weight (lbs): 2# Internal Rotation: Strengthening;10 reps;Weights Internal Rotation Weight (lbs): 2# Flexion: Strengthening;10 reps;Weights Flexion Weight (lbs): 2# Abduction: Strengthening;10 reps;Weights ABduction Weight (lbs): 2# Therapy Ball Flexion: 20 reps ABduction: 20 reps Right/Left: 5 reps ROM / Strengthening / Isometric Strengthening UBE (Upper Arm Bike): 3' forward, 3' reverse 3.0 Wall Wash: 3'    Manual Therapy Manual Therapy: Myofascial release Myofascial Release: MFR and manual stretching to left upper arm, scapular, and shoulder region to decrease pain and increase A/PROM to [facility].  Occupational Therapy Assessment and Plan OT Assessment and Plan Clinical Impression Statement: A:   Measured for JAS Splint for increasing shoulder AROM and decrease rehab time. Rehab Potential: Good OT Plan: P:  Set appointment with JAS rep.   Goals Short Term Goals Time to Complete Short Term Goals: 4 weeks Short Term Goal 1: Patient will be educated on HEP. Short Term Goal 2: Patient will increase left shoulder PROM to [facility] for increased ability to don deoderant and wash under her arm. 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 Short Term Goal 4: Patient will decrease pain to 2/10 with activity. Short Term Goal 5: Patient will decrease fascial restrictions from max to mod in her left shoulder region. Long Term Goals Time to Complete Long Term Goals: 8 weeks Long Term Goal 1: Patient will complete all B/IADLs, work, and leisure activities with LUE. Long Term Goal 2: Patient will increase left shoulder AROM to WNL for increased ability to place items in overhead cabinets. 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 Long Term Goal 4: Patient will decrease pain to 1/10 in her left shoulder while working. Long Term Goal 5: Patient will decrease fasical restrictions to trace in her left shoulder region.  Problem List Patient Active Problem List  Diagnoses  . Frozen shoulder  . Pain in joint, shoulder region  . Muscle weakness (generalized)    End of Session Activity Tolerance: Patient tolerated treatment well General Behavior During Session: [facility] for tasks performed Cognition: [facility] for tasks performed   Natika Geyer L. Noralee Stain, COTA/L  01/11/2012, 6:34 PM

## 2012-01-13 ENCOUNTER — Ambulatory Visit (HOSPITAL_COMMUNITY): Payer: BC Managed Care – PPO | Admitting: Occupational Therapy

## 2012-01-15 ENCOUNTER — Telehealth (HOSPITAL_COMMUNITY): Payer: Self-pay | Admitting: Occupational Therapy

## 2012-01-15 ENCOUNTER — Ambulatory Visit (HOSPITAL_COMMUNITY): Payer: BC Managed Care – PPO | Admitting: Occupational Therapy

## 2012-01-18 ENCOUNTER — Ambulatory Visit (HOSPITAL_COMMUNITY): Payer: BC Managed Care – PPO | Admitting: Occupational Therapy

## 2012-01-19 ENCOUNTER — Inpatient Hospital Stay (HOSPITAL_COMMUNITY)
Admission: RE | Admit: 2012-01-19 | Payer: BC Managed Care – PPO | Source: Ambulatory Visit | Admitting: Occupational Therapy

## 2012-01-20 ENCOUNTER — Telehealth (HOSPITAL_COMMUNITY): Payer: Self-pay | Admitting: Occupational Therapy

## 2012-01-25 ENCOUNTER — Ambulatory Visit (HOSPITAL_COMMUNITY): Payer: BC Managed Care – PPO | Admitting: Occupational Therapy

## 2012-04-13 ENCOUNTER — Ambulatory Visit (HOSPITAL_COMMUNITY)
Admission: RE | Admit: 2012-04-13 | Discharge: 2012-04-13 | Disposition: A | Discharge status: Home / Self Care | Payer: BC Managed Care – PPO | Source: Ambulatory Visit | Attending: Pulmonary Disease | Admitting: Pulmonary Disease

## 2012-04-13 ENCOUNTER — Other Ambulatory Visit (HOSPITAL_COMMUNITY): Payer: Self-pay | Admitting: Pulmonary Disease

## 2012-04-13 ENCOUNTER — Encounter (HOSPITAL_COMMUNITY): Payer: Self-pay

## 2012-04-13 DIAGNOSIS — I10 Essential (primary) hypertension: Secondary | ICD-10-CM | POA: Insufficient documentation

## 2012-04-13 DIAGNOSIS — E119 Type 2 diabetes mellitus without complications: Secondary | ICD-10-CM | POA: Insufficient documentation

## 2012-04-13 DIAGNOSIS — K429 Umbilical hernia without obstruction or gangrene: Secondary | ICD-10-CM | POA: Insufficient documentation

## 2012-04-13 DIAGNOSIS — R109 Unspecified abdominal pain: Secondary | ICD-10-CM | POA: Insufficient documentation

## 2012-04-14 ENCOUNTER — Other Ambulatory Visit (HOSPITAL_COMMUNITY): Payer: Self-pay | Admitting: Pulmonary Disease

## 2012-04-14 ENCOUNTER — Ambulatory Visit (HOSPITAL_COMMUNITY): Payer: BC Managed Care – PPO

## 2012-04-14 ENCOUNTER — Ambulatory Visit (HOSPITAL_COMMUNITY)
Admission: RE | Admit: 2012-04-14 | Discharge: 2012-04-14 | Disposition: A | Discharge status: Home / Self Care | Payer: BC Managed Care – PPO | Source: Ambulatory Visit | Attending: Pulmonary Disease | Admitting: Pulmonary Disease

## 2012-04-14 DIAGNOSIS — R188 Other ascites: Secondary | ICD-10-CM

## 2012-04-14 DIAGNOSIS — N949 Unspecified condition associated with female genital organs and menstrual cycle: Secondary | ICD-10-CM | POA: Insufficient documentation

## 2012-04-14 DIAGNOSIS — R9389 Abnormal findings on diagnostic imaging of other specified body structures: Secondary | ICD-10-CM | POA: Insufficient documentation

## 2012-07-18 ENCOUNTER — Other Ambulatory Visit (HOSPITAL_COMMUNITY): Payer: Self-pay | Admitting: Obstetrics and Gynecology

## 2012-07-18 DIAGNOSIS — Z139 Encounter for screening, unspecified: Secondary | ICD-10-CM

## 2012-07-19 ENCOUNTER — Ambulatory Visit (HOSPITAL_COMMUNITY)
Admission: RE | Admit: 2012-07-19 | Discharge: 2012-07-19 | Disposition: A | Discharge status: Home / Self Care | Payer: BC Managed Care – PPO | Source: Ambulatory Visit | Attending: Obstetrics and Gynecology | Admitting: Obstetrics and Gynecology

## 2012-07-19 DIAGNOSIS — Z1231 Encounter for screening mammogram for malignant neoplasm of breast: Secondary | ICD-10-CM | POA: Insufficient documentation

## 2012-07-19 DIAGNOSIS — Z139 Encounter for screening, unspecified: Secondary | ICD-10-CM

## 2012-08-12 ENCOUNTER — Ambulatory Visit (INDEPENDENT_AMBULATORY_CARE_PROVIDER_SITE_OTHER): Payer: BC Managed Care – PPO | Admitting: Surgery

## 2012-08-30 ENCOUNTER — Encounter (INDEPENDENT_AMBULATORY_CARE_PROVIDER_SITE_OTHER): Payer: Self-pay | Admitting: Surgery

## 2012-08-30 ENCOUNTER — Ambulatory Visit (INDEPENDENT_AMBULATORY_CARE_PROVIDER_SITE_OTHER): Payer: BC Managed Care – PPO | Admitting: Surgery

## 2012-08-30 DIAGNOSIS — N393 Stress incontinence (female) (male): Secondary | ICD-10-CM | POA: Insufficient documentation

## 2012-09-06 ENCOUNTER — Encounter (INDEPENDENT_AMBULATORY_CARE_PROVIDER_SITE_OTHER): Payer: Self-pay | Admitting: Surgery

## 2012-10-03 ENCOUNTER — Encounter (INDEPENDENT_AMBULATORY_CARE_PROVIDER_SITE_OTHER): Payer: Self-pay | Admitting: Surgery

## 2012-10-03 ENCOUNTER — Ambulatory Visit (INDEPENDENT_AMBULATORY_CARE_PROVIDER_SITE_OTHER): Payer: BC Managed Care – PPO | Admitting: Surgery

## 2012-10-03 VITALS — BP 120/80 | HR 94 | Temp 98.4°F | Resp 20 | Ht 65.0 in | Wt 198.0 lb

## 2012-10-03 DIAGNOSIS — R142 Eructation: Secondary | ICD-10-CM

## 2012-10-03 DIAGNOSIS — K5909 Other constipation: Secondary | ICD-10-CM

## 2012-10-03 DIAGNOSIS — M6208 Separation of muscle (nontraumatic), other site: Secondary | ICD-10-CM | POA: Insufficient documentation

## 2012-10-03 DIAGNOSIS — K5900 Constipation, unspecified: Secondary | ICD-10-CM

## 2012-10-03 DIAGNOSIS — M6200 Separation of muscle (nontraumatic), unspecified site: Secondary | ICD-10-CM

## 2012-10-03 DIAGNOSIS — E669 Obesity, unspecified: Secondary | ICD-10-CM

## 2012-10-03 DIAGNOSIS — R140 Abdominal distension (gaseous): Secondary | ICD-10-CM

## 2012-10-03 NOTE — Progress Notes (Signed)
Subjective:     Patient ID: Monica Lowery, female   DOB: [birth year], 41 y.o.   MRN: [id number]  HPI  Monica Lowery  [birth year] [id number]  Patient Care Team: Fredirick Maudlin, MD as PCP - General (Internal Medicine) Serita Kyle, MD as Consulting Physician (Obstetrics and Gynecology)  This patient is a 41 y.o.female who presents today for surgical evaluation at the request of Dr. Cherly Hensen.   Reason for evaluation: Possible hernia at bellybutton.  Pleasant obese insulin dependent diabetic.  Gets intermittent bloating.  No nausea or vomiting.  No radiation to the back. Had a CT scan done a few months ago.  Found to have liver lesions most consistent with focal nodular hyperplasia.  Seeing a hepatologist in [place].  Stable.  There was concern of an umbilical hernia.  Ovarian cyst followed by Dr. Cherly Hensen.  Appears benign and improved.  She was sent to me for evaluation.    No personal nor family history of GI/colon cancer, inflammatory bowel disease, irritable bowel syndrome, allergy such as Celiac Sprue, dietary/dairy problems, colitis, ulcers nor gastritis.  Normal he can eat any food without much difficulty.No recent sick contacts/gastroenteritis.  No travel outside the country.  No changes in diet.  Patient walks 20 minutes for about 1/2 miles without difficulty.  No exertional chest/neck/shoulder/arm pain.  She recalls getting a stress test on a treadmill that was normal.Her glucose control has gotten worse.  She is trying to establish with an endocrinologist, Dr. Ardyth Harps, soon.  Patient Active Problem List  Diagnosis  . Frozen shoulder  . Pain in joint, shoulder region  . Muscle weakness (generalized)  . Stress incontinence, female  . Constipation, chronic  . Obesity (BMI 30-39.9)  . Abdominal bloating    Past Medical History  Diagnosis Date  . Diabetes mellitus   . Thyroid disease     Past Surgical History  Procedure Date  . Partial hysterectomy 2001      uterine prolapse, fibroids  . Shoulder surgery 2012    left  . Tubal ligation 2001    History   Social History  . Marital Status: Married    Spouse Name: N/A    Number of Children: N/A  . Years of Education: N/A   Occupational History  . Not on file.   Social History Main Topics  . Smoking status: Never Smoker   . Smokeless tobacco: Not on file  . Alcohol Use: No  . Drug Use: No  . Sexually Active: Not on file   Other Topics Concern  . Not on file   Social History Narrative  . No narrative on file    No family history on file.  Current Outpatient Prescriptions  Medication Sig Dispense Refill  . aspirin 81 MG tablet Take 81 mg by mouth daily.      . cetirizine (ZYRTEC) 10 MG tablet Take 10 mg by mouth daily.      . insulin aspart (NOVOLOG) 100 UNIT/ML injection Inject 30 Units into the skin at bedtime.      Marland Kitchen LANTUS 100 UNIT/ML injection       . levothyroxine (SYNTHROID, LEVOTHROID) 175 MCG tablet       . ONE TOUCH ULTRA TEST test strip          No Known Allergies  BP 120/80  Pulse 94  Temp 98.4 F (36.9 C) (Temporal)  Resp 20  Ht 5\' 5"  (1.651 m)  Wt 198 lb (89.812 kg)  BMI 32.95 kg/m2  No results found.   Review of Systems  Constitutional: Negative for fever, chills and diaphoresis.  HENT: Positive for congestion. Negative for ear pain, sore throat and trouble swallowing.   Eyes: Negative for photophobia and visual disturbance.  Respiratory: Negative for cough and choking.   Cardiovascular: Positive for leg swelling. Negative for chest pain and palpitations.  Gastrointestinal: Positive for constipation and abdominal distention. Negative for nausea, vomiting, abdominal pain, diarrhea, blood in stool, anal bleeding and rectal pain.  Genitourinary: Negative for dysuria, frequency and difficulty urinating.  Musculoskeletal: Negative for myalgias and gait problem.  Skin: Negative for color change, pallor and rash.  Neurological: Negative for  dizziness, speech difficulty, weakness and numbness.  Hematological: Negative for adenopathy.  Psychiatric/Behavioral: Negative for confusion and agitation. The patient is not nervous/anxious.        Objective:   Physical Exam  Constitutional: She is oriented to person, place, and time. She appears well-developed and well-nourished. No distress.  HENT:  Head: Normocephalic.  Mouth/Throat: Oropharynx is clear and moist. No oropharyngeal exudate.  Eyes: Conjunctivae normal and EOM are normal. Pupils are equal, round, and reactive to light. No scleral icterus.  Neck: Normal range of motion. Neck supple. No tracheal deviation present.  Cardiovascular: Normal rate, regular rhythm and intact distal pulses.   Pulmonary/Chest: Effort normal and breath sounds normal. No respiratory distress. She exhibits no tenderness.  Abdominal: Soft. Bowel sounds are normal. She exhibits no distension and no mass. There is no tenderness. There is no rigidity, no rebound, no guarding, no CVA tenderness, no tenderness at McBurney's point and negative Murphy's sign. No hernia. Hernia confirmed negative in the ventral area, confirmed negative in the right inguinal area and confirmed negative in the left inguinal area.    Genitourinary: No vaginal discharge found.  Musculoskeletal: Normal range of motion. She exhibits no tenderness.  Lymphadenopathy:    She has no cervical adenopathy.       Right: No inguinal adenopathy present.       Left: No inguinal adenopathy present.  Neurological: She is alert and oriented to person, place, and time. No cranial nerve deficit. She exhibits normal muscle tone. Coordination normal.  Skin: Skin is warm and dry. No rash noted. She is not diaphoretic. No cyanosis or erythema. Nails show no clubbing.       Ticklish and jumpy especially and neck, axilla, bellybutton  Psychiatric: She has a normal mood and affect. Her behavior is normal. Judgment and thought content normal.        Assessment:     Mild sensitivity and diastases at the umbilicus.  No hernia.  Bloating and constipation.  No evidence of bowel obstruction    Plan:     Increase activity as tolerated to regular activity.  Do not push through pain.  Diet as tolerated. Bowel regimen to avoid problems.  I strongly recommend she try a fiber regimen.  Goal is one bowel movement a day.  I think that is the biggest contributor to her intermittent bloating.  Not consistent with biliary colic or reflux or obstruction.  Return to clinic p.r.n.   Instructions discussed.  Followup with primary care physician for other health issues as would normally be done.  Questions answered.  The patient expressed understanding and appreciation

## 2012-10-03 NOTE — Patient Instructions (Addendum)
You do not have a hernia.  You have mild diastases recti.  This is a mild stretching of the abdominal wall tendon.  It is not anything to be concerned of but a normal variant of anatomy.  It is not a hernia.  GETTING TO GOOD BOWEL HEALTH. Irregular bowel habits such as constipation and diarrhea can lead to many problems over time.  Having one soft bowel movement a day is the most important way to prevent further problems.  The anorectal canal is designed to handle stretching and feces to safely manage our ability to get rid of solid waste (feces, poop, stool) out of our body.  BUT, hard constipated stools can act like ripping concrete bricks and diarrhea can be a burning fire to this very sensitive area of our body, causing inflamed hemorrhoids, anal fissures, increasing risk is perirectal abscesses, abdominal pain/bloating, an making irritable bowel worse.     The goal: ONE SOFT BOWEL MOVEMENT A DAY!  To have soft, regular bowel movements:    Drink at least 8 tall glasses of water a day.     Take plenty of fiber.  Fiber is the undigested part of plant food that passes into the colon, acting s "natures broom" to encourage bowel motility and movement.  Fiber can absorb and hold large amounts of water. This results in a larger, bulkier stool, which is soft and easier to pass. Work gradually over several weeks up to 6 servings a day of fiber (25g a day even more if needed) in the form of: o Vegetables -- Root (potatoes, carrots, turnips), leafy green (lettuce, salad greens, celery, spinach), or cooked high residue (cabbage, broccoli, etc) o Fruit -- Fresh (unpeeled skin & pulp), Dried (prunes, apricots, cherries, etc ),  or stewed ( applesauce)  o Whole grain breads, pasta, etc (whole wheat)  o Bran cereals    Bulking Agents -- This type of water-retaining fiber generally is easily obtained each day by one of the following:  o Psyllium bran -- The psyllium plant is remarkable because its ground seeds can  retain so much water. This product is available as Metamucil, Konsyl, Effersyllium, Per Diem Fiber, or the less expensive generic preparation in drug and health food stores. Although labeled a laxative, it really is not a laxative.  o Methylcellulose -- This is another fiber derived from wood which also retains water. It is available as Citrucel. o Polyethylene Glycol - and "artificial" fiber commonly called Miralax or Glycolax.  It is helpful for people with gassy or bloated feelings with regular fiber o Flax Seed - a less gassy fiber than psyllium   No reading or other relaxing activity while on the toilet. If bowel movements take longer than 5 minutes, you are too constipated   AVOID CONSTIPATION.  High fiber and water intake usually takes care of this.  Sometimes a laxative is needed to stimulate more frequent bowel movements, but    Laxatives are not a good long-term solution as it can wear the colon out. o Osmotics (Milk of Magnesia, Fleets phosphosoda, Magnesium citrate, MiraLax, GoLytely) are safer than  o Stimulants (Senokot, Castor Oil, Dulcolax, Ex Lax)    o Do not take laxatives for more than 7days in a row.    IF SEVERELY CONSTIPATED, try a Bowel Retraining Program: o Do not use laxatives.  o Eat a diet high in roughage, such as bran cereals and leafy vegetables.  o Drink six (6) ounces of prune or apricot juice each  morning.  o Eat two (2) large servings of stewed fruit each day.  o Take one (1) heaping tablespoon of a psyllium-based bulking agent twice a day. Use sugar-free sweetener when possible to avoid excessive calories.  o Eat a normal breakfast.  o Set aside 15 minutes after breakfast to sit on the toilet, but do not strain to have a bowel movement.  o If you do not have a bowel movement by the third day, use an enema and repeat the above steps.    Controlling diarrhea o Switch to liquids and simpler foods for a few days to avoid stressing your intestines further. o Avoid  dairy products (especially milk & ice cream) for a short time.  The intestines often can lose the ability to digest lactose when stressed. o Avoid foods that cause gassiness or bloating.  Typical foods include beans and other legumes, cabbage, broccoli, and dairy foods.  Every person has some sensitivity to other foods, so listen to our body and avoid those foods that trigger problems for you. o Adding fiber (Citrucel, Metamucil, psyllium, Miralax) gradually can help thicken stools by absorbing excess fluid and retrain the intestines to act more normally.  Slowly increase the dose over a few weeks.  Too much fiber too soon can backfire and cause cramping & bloating. o Probiotics (such as active yogurt, Align, etc) may help repopulate the intestines and colon with normal bacteria and calm down a sensitive digestive tract.  Most studies show it to be of mild help, though, and such products can be costly. o Medicines:   Bismuth subsalicylate (ex. Kayopectate, Pepto Bismol) every 30 minutes for up to 6 doses can help control diarrhea.  Avoid if pregnant.   Loperamide (Immodium) can slow down diarrhea.  Start with two tablets (4mg  total) first and then try one tablet every 6 hours.  Avoid if you are having fevers or severe pain.  If you are not better or start feeling worse, stop all medicines and call your doctor for advice o Call your doctor if you are getting worse or not better.  Sometimes further testing (cultures, endoscopy, X-ray studies, bloodwork, etc) may be needed to help diagnose and treat the cause of the diarrhea. o

## 2013-01-23 ENCOUNTER — Encounter (INDEPENDENT_AMBULATORY_CARE_PROVIDER_SITE_OTHER): Payer: Self-pay

## 2013-03-06 ENCOUNTER — Other Ambulatory Visit: Payer: Self-pay | Admitting: Obstetrics and Gynecology

## 2013-03-07 ENCOUNTER — Encounter (HOSPITAL_COMMUNITY): Payer: Self-pay

## 2013-03-09 ENCOUNTER — Encounter (HOSPITAL_COMMUNITY)
Admission: RE | Admit: 2013-03-09 | Discharge: 2013-03-09 | Disposition: A | Discharge status: Home / Self Care | Payer: BC Managed Care – PPO | Source: Ambulatory Visit | Attending: Obstetrics and Gynecology | Admitting: Obstetrics and Gynecology

## 2013-03-09 ENCOUNTER — Encounter (HOSPITAL_COMMUNITY): Payer: Self-pay

## 2013-03-09 HISTORY — DX: Cardiac arrhythmia, unspecified: I49.9

## 2013-03-09 HISTORY — DX: Endocarditis, valve unspecified: I38

## 2013-03-09 HISTORY — DX: Hypothyroidism, unspecified: E03.9

## 2013-03-09 HISTORY — DX: Cardiac murmur, unspecified: R01.1

## 2013-03-09 HISTORY — DX: Nausea with vomiting, unspecified: R11.2

## 2013-03-09 HISTORY — DX: Other specified diseases of liver: K76.89

## 2013-03-09 HISTORY — DX: Headache: R51

## 2013-03-09 HISTORY — DX: Other specified postprocedural states: Z98.890

## 2013-03-09 LAB — CBC
HCT: 40.2 % (ref 36.0–46.0)
RBC: 4.63 MIL/uL (ref 3.87–5.11)
RDW: 12.2 % (ref 11.5–15.5)
WBC: 5.6 10*3/uL (ref 4.0–10.5)

## 2013-03-09 LAB — BASIC METABOLIC PANEL
Chloride: 100 mEq/L (ref 96–112)
Creatinine, Ser: 0.8 mg/dL (ref 0.50–1.10)
GFR calc Af Amer: >90 mL/min (ref >90)
Potassium: 4 mEq/L (ref 3.5–5.1)
Sodium: 138 mEq/L (ref 135–145)

## 2013-03-09 LAB — SURGICAL PCR SCREEN: Staphylococcus aureus: NEGATIVE

## 2013-03-09 NOTE — Patient Instructions (Signed)
Your procedure is scheduled on: 03/15/13  Enter through the Main Entrance at :1130am  Pick up desk phone and dial [phone] and inform us of your arrival.  Please call [phone] if you have any problems the morning of surgery.  Remember: Do not eat after midnight:Tuesday Clear liquids ok until 9am on Wed.  Take 1/2 dose of evening insulin Tuesday pm  Take these meds the morning of surgery with a sip of water: thyroid med Hold am insulin  DO NOT wear jewelry, eye make-up, lipstick,body lotion, or dark fingernail polish.   If you are to be admitted after surgery, leave suitcase in car until your room has been assigned. Patients discharged on the day of surgery will not be allowed to drive home.

## 2013-03-15 ENCOUNTER — Encounter (HOSPITAL_COMMUNITY): Payer: Self-pay | Admitting: Anesthesiology

## 2013-03-15 ENCOUNTER — Ambulatory Visit (HOSPITAL_COMMUNITY): Payer: BC Managed Care – PPO | Admitting: Anesthesiology

## 2013-03-15 ENCOUNTER — Encounter (HOSPITAL_COMMUNITY)
Admission: RE | Disposition: A | Discharge status: Home / Self Care | Payer: Self-pay | Source: Ambulatory Visit | Attending: Obstetrics and Gynecology

## 2013-03-15 ENCOUNTER — Ambulatory Visit (HOSPITAL_COMMUNITY)
Admission: RE | Admit: 2013-03-15 | Discharge: 2013-03-15 | Disposition: A | Discharge status: Home / Self Care | Payer: BC Managed Care – PPO | Source: Ambulatory Visit | Attending: Obstetrics and Gynecology | Admitting: Obstetrics and Gynecology

## 2013-03-15 DIAGNOSIS — Z90710 Acquired absence of both cervix and uterus: Secondary | ICD-10-CM | POA: Insufficient documentation

## 2013-03-15 DIAGNOSIS — N83209 Unspecified ovarian cyst, unspecified side: Secondary | ICD-10-CM | POA: Insufficient documentation

## 2013-03-15 DIAGNOSIS — Z90721 Acquired absence of ovaries, unilateral: Secondary | ICD-10-CM

## 2013-03-15 HISTORY — PX: ROBOTIC ASSISTED LAPAROSCOPIC LYSIS OF ADHESION: SHX6080

## 2013-03-15 HISTORY — PX: OVARIAN CYST REMOVAL: SHX89

## 2013-03-15 HISTORY — PX: SALPINGOOPHORECTOMY: SHX82

## 2013-03-15 HISTORY — PX: UNILATERAL SALPINGECTOMY: SHX6160

## 2013-03-15 LAB — GLUCOSE, CAPILLARY: Glucose-Capillary: 333 mg/dL — ABNORMAL HIGH (ref 70–99)

## 2013-03-15 SURGERY — SALPINGO-OOPHORECTOMY, OPEN
Anesthesia: General | Site: Abdomen | Laterality: Right | Wound class: Clean Contaminated

## 2013-03-15 MED ORDER — INSULIN ASPART 100 UNIT/ML ~~LOC~~ SOLN
4.0000 [IU] | Freq: Once | SUBCUTANEOUS | Status: AC
  Administered 2013-03-15: 4 [IU] via SUBCUTANEOUS

## 2013-03-15 MED ORDER — SODIUM CHLORIDE 0.9 % IJ SOLN
INTRAMUSCULAR | Status: DC | PRN
  Administered 2013-03-15: 10 mL

## 2013-03-15 MED ORDER — MIDAZOLAM HCL 5 MG/5ML IJ SOLN
INTRAMUSCULAR | Status: DC | PRN
  Administered 2013-03-15: 2 mg via INTRAVENOUS

## 2013-03-15 MED ORDER — KETOROLAC TROMETHAMINE 30 MG/ML IJ SOLN
INTRAMUSCULAR | Status: AC
  Filled 2013-03-15: qty 1

## 2013-03-15 MED ORDER — ROCURONIUM BROMIDE 100 MG/10ML IV SOLN
INTRAVENOUS | Status: DC | PRN
  Administered 2013-03-15: 50 mg via INTRAVENOUS
  Administered 2013-03-15: 20 mg via INTRAVENOUS
  Administered 2013-03-15: 30 mg via INTRAVENOUS
  Administered 2013-03-15: 20 mg via INTRAVENOUS
  Administered 2013-03-15: 10 mg via INTRAVENOUS

## 2013-03-15 MED ORDER — HYDROCODONE-IBUPROFEN 5-200 MG PO TABS: 1.0000 | ORAL_TABLET | Freq: Three times a day (TID) | ORAL | Status: DC | PRN

## 2013-03-15 MED ORDER — FENTANYL CITRATE 0.05 MG/ML IJ SOLN
25.0000 ug | INTRAMUSCULAR | Status: DC | PRN
  Administered 2013-03-15: 50 ug via INTRAVENOUS

## 2013-03-15 MED ORDER — GLYCOPYRROLATE 0.2 MG/ML IJ SOLN
INTRAMUSCULAR | Status: DC | PRN
  Administered 2013-03-15: 0.6 mg via INTRAVENOUS

## 2013-03-15 MED ORDER — LIDOCAINE HCL (CARDIAC) 20 MG/ML IV SOLN
INTRAVENOUS | Status: DC | PRN
  Administered 2013-03-15 (×2): 30 mg via INTRAVENOUS

## 2013-03-15 MED ORDER — INSULIN ASPART 100 UNIT/ML ~~LOC~~ SOLN
SUBCUTANEOUS | Status: AC
  Filled 2013-03-15: qty 1

## 2013-03-15 MED ORDER — MEPERIDINE HCL 25 MG/ML IJ SOLN: 6.2500 mg | INTRAMUSCULAR | Status: DC | PRN

## 2013-03-15 MED ORDER — INSULIN ASPART 100 UNIT/ML ~~LOC~~ SOLN: 2.0000 [IU] | Freq: Once | SUBCUTANEOUS | Status: DC

## 2013-03-15 MED ORDER — MIDAZOLAM HCL 2 MG/2ML IJ SOLN
INTRAMUSCULAR | Status: AC
  Filled 2013-03-15: qty 2

## 2013-03-15 MED ORDER — ROCURONIUM BROMIDE 50 MG/5ML IV SOLN
INTRAVENOUS | Status: AC
  Filled 2013-03-15: qty 1

## 2013-03-15 MED ORDER — BUPIVACAINE HCL (PF) 0.25 % IJ SOLN
INTRAMUSCULAR | Status: DC | PRN
  Administered 2013-03-15: 15 mL

## 2013-03-15 MED ORDER — SCOPOLAMINE 1 MG/3DAYS TD PT72
MEDICATED_PATCH | TRANSDERMAL | Status: AC
  Filled 2013-03-15: qty 1

## 2013-03-15 MED ORDER — FENTANYL CITRATE 0.05 MG/ML IJ SOLN
INTRAMUSCULAR | Status: AC
  Filled 2013-03-15: qty 5

## 2013-03-15 MED ORDER — FENTANYL CITRATE 0.05 MG/ML IJ SOLN
INTRAMUSCULAR | Status: AC
  Administered 2013-03-15: 50 ug via INTRAVENOUS
  Filled 2013-03-15: qty 2

## 2013-03-15 MED ORDER — ONDANSETRON HCL 4 MG/2ML IJ SOLN
INTRAMUSCULAR | Status: AC
  Filled 2013-03-15: qty 2

## 2013-03-15 MED ORDER — PROPOFOL 10 MG/ML IV EMUL
INTRAVENOUS | Status: DC | PRN
  Administered 2013-03-15: 120 mg via INTRAVENOUS
  Administered 2013-03-15: 200 mg via INTRAVENOUS

## 2013-03-15 MED ORDER — PROPOFOL 10 MG/ML IV EMUL
INTRAVENOUS | Status: AC
  Filled 2013-03-15: qty 20

## 2013-03-15 MED ORDER — ONDANSETRON HCL 4 MG/2ML IJ SOLN
INTRAMUSCULAR | Status: AC
  Administered 2013-03-15: 4 mg via INTRAVENOUS
  Filled 2013-03-15: qty 2

## 2013-03-15 MED ORDER — ACETAMINOPHEN 10 MG/ML IV SOLN
INTRAVENOUS | Status: DC | PRN
  Administered 2013-03-15: 1000 mg via INTRAVENOUS

## 2013-03-15 MED ORDER — ONDANSETRON HCL 4 MG/2ML IJ SOLN
INTRAMUSCULAR | Status: DC | PRN
  Administered 2013-03-15: 4 mg via INTRAVENOUS

## 2013-03-15 MED ORDER — BUPIVACAINE HCL (PF) 0.25 % IJ SOLN
INTRAMUSCULAR | Status: AC
  Filled 2013-03-15: qty 30

## 2013-03-15 MED ORDER — GLYCOPYRROLATE 0.2 MG/ML IJ SOLN
INTRAMUSCULAR | Status: AC
  Filled 2013-03-15: qty 3

## 2013-03-15 MED ORDER — INSULIN ASPART 100 UNIT/ML ~~LOC~~ SOLN
2.0000 [IU] | Freq: Once | SUBCUTANEOUS | Status: AC
  Administered 2013-03-15: 2 [IU] via SUBCUTANEOUS

## 2013-03-15 MED ORDER — NEOSTIGMINE METHYLSULFATE 1 MG/ML IJ SOLN
INTRAMUSCULAR | Status: DC | PRN
  Administered 2013-03-15: 4 mg via INTRAVENOUS

## 2013-03-15 MED ORDER — DEXAMETHASONE SODIUM PHOSPHATE 4 MG/ML IJ SOLN
INTRAMUSCULAR | Status: DC | PRN
  Administered 2013-03-15: 10 mg via INTRAVENOUS

## 2013-03-15 MED ORDER — NEOSTIGMINE METHYLSULFATE 1 MG/ML IJ SOLN
INTRAMUSCULAR | Status: AC
  Filled 2013-03-15: qty 1

## 2013-03-15 MED ORDER — DEXAMETHASONE SODIUM PHOSPHATE 10 MG/ML IJ SOLN
INTRAMUSCULAR | Status: AC
  Filled 2013-03-15: qty 1

## 2013-03-15 MED ORDER — SCOPOLAMINE 1 MG/3DAYS TD PT72
1.0000 | MEDICATED_PATCH | TRANSDERMAL | Status: DC
  Administered 2013-03-15: 1.5 mg via TRANSDERMAL

## 2013-03-15 MED ORDER — ACETAMINOPHEN 10 MG/ML IV SOLN
INTRAVENOUS | Status: AC
  Filled 2013-03-15: qty 100

## 2013-03-15 MED ORDER — FENTANYL CITRATE 0.05 MG/ML IJ SOLN
INTRAMUSCULAR | Status: DC | PRN
  Administered 2013-03-15 (×2): 50 ug via INTRAVENOUS
  Administered 2013-03-15 (×2): 100 ug via INTRAVENOUS
  Administered 2013-03-15 (×4): 50 ug via INTRAVENOUS

## 2013-03-15 MED ORDER — LACTATED RINGERS IR SOLN
Status: DC | PRN
  Administered 2013-03-15: 3000 mL

## 2013-03-15 MED ORDER — ACETAMINOPHEN 10 MG/ML IV SOLN: 1000.0000 mg | Freq: Once | INTRAVENOUS | Status: DC

## 2013-03-15 MED ORDER — KETOROLAC TROMETHAMINE 30 MG/ML IJ SOLN: 15.0000 mg | Freq: Once | INTRAMUSCULAR | Status: DC | PRN

## 2013-03-15 MED ORDER — LACTATED RINGERS IV SOLN
INTRAVENOUS | Status: DC
  Administered 2013-03-15 (×2): via INTRAVENOUS

## 2013-03-15 MED ORDER — LIDOCAINE HCL (CARDIAC) 20 MG/ML IV SOLN
INTRAVENOUS | Status: AC
  Filled 2013-03-15: qty 5

## 2013-03-15 MED ORDER — ONDANSETRON HCL 4 MG/2ML IJ SOLN: 4.0000 mg | Freq: Once | INTRAMUSCULAR | Status: AC | PRN

## 2013-03-15 SURGICAL SUPPLY — 53 items
BAG SPEC RTRVL LRG 6X4 10 (ENDOMECHANICALS) ×2
BARRIER ADHS 3X4 INTERCEED (GAUZE/BANDAGES/DRESSINGS) ×6 IMPLANT
CABLE HIGH FREQUENCY MONO STRZ (ELECTRODE) ×6 IMPLANT
CATH ROBINSON RED A/P 16FR (CATHETERS) ×6 IMPLANT
CHLORAPREP W/TINT 26ML (MISCELLANEOUS) ×6 IMPLANT
CLOTH BEACON ORANGE TIMEOUT ST (SAFETY) ×6 IMPLANT
CONT PATH 16OZ SNAP LID 3702 (MISCELLANEOUS) ×6 IMPLANT
COVER MAYO STAND STRL (DRAPES) ×6 IMPLANT
COVER TABLE BACK 60X90 (DRAPES) ×12 IMPLANT
COVER TIP SHEARS 8 DVNC (MISCELLANEOUS) ×5 IMPLANT
COVER TIP SHEARS 8MM DA VINCI (MISCELLANEOUS) ×1
DECANTER SPIKE VIAL GLASS SM (MISCELLANEOUS) ×6 IMPLANT
DERMABOND ADVANCED (GAUZE/BANDAGES/DRESSINGS) ×2
DERMABOND ADVANCED .7 DNX12 (GAUZE/BANDAGES/DRESSINGS) ×10 IMPLANT
DRAPE HUG U DISPOSABLE (DRAPE) ×6 IMPLANT
DRAPE LG THREE QUARTER DISP (DRAPES) ×18 IMPLANT
DRAPE WARM FLUID 44X44 (DRAPE) ×6 IMPLANT
ELECT REM PT RETURN 9FT ADLT (ELECTROSURGICAL) ×6
ELECTRODE REM PT RTRN 9FT ADLT (ELECTROSURGICAL) ×5 IMPLANT
EVACUATOR SMOKE 8.L (FILTER) ×12 IMPLANT
GAUZE VASELINE 3X9 (GAUZE/BANDAGES/DRESSINGS) ×6 IMPLANT
GLOVE BIO SURGEON STRL SZ 6.5 (GLOVE) ×30 IMPLANT
GLOVE BIOGEL PI IND STRL 7.0 (GLOVE) ×30 IMPLANT
GLOVE BIOGEL PI INDICATOR 7.0 (GLOVE) ×6
GLOVE ECLIPSE 6.5 STRL STRAW (GLOVE) ×30 IMPLANT
GLOVE SURG SS PI 7.0 STRL IVOR (GLOVE) ×36 IMPLANT
GOWN STRL REIN XL XLG (GOWN DISPOSABLE) ×48 IMPLANT
KIT ACCESSORY DA VINCI DISP (KITS) ×1
KIT ACCESSORY DVNC DISP (KITS) ×5 IMPLANT
LEGGING LITHOTOMY PAIR STRL (DRAPES) ×6 IMPLANT
NEEDLE INSUFFLATION 150MM (ENDOMECHANICALS) ×6 IMPLANT
PACK LAPAROSCOPY BASIN (CUSTOM PROCEDURE TRAY) ×6 IMPLANT
PACK LAVH (CUSTOM PROCEDURE TRAY) ×6 IMPLANT
PAD PREP 24X48 CUFFED NSTRL (MISCELLANEOUS) ×12 IMPLANT
POUCH SPECIMEN RETRIEVAL 10MM (ENDOMECHANICALS) ×6 IMPLANT
PROTECTOR NERVE ULNAR (MISCELLANEOUS) ×12 IMPLANT
SCISSORS LAP 5X35 DISP (ENDOMECHANICALS) ×6 IMPLANT
SET IRRIG TUBING LAPAROSCOPIC (IRRIGATION / IRRIGATOR) ×6 IMPLANT
SOLUTION ELECTROLUBE (MISCELLANEOUS) ×6 IMPLANT
SUT VICRYL 0 UR6 27IN ABS (SUTURE) ×6 IMPLANT
SUT VICRYL 4-0 PS2 18IN ABS (SUTURE) ×12 IMPLANT
SYR 50ML LL SCALE MARK (SYRINGE) ×6 IMPLANT
TOWEL OR 17X24 6PK STRL BLUE (TOWEL DISPOSABLE) ×18 IMPLANT
TRAY FOLEY BAG SILVER LF 14FR (CATHETERS) ×6 IMPLANT
TROCAR 12M 150ML BLUNT (TROCAR) ×6 IMPLANT
TROCAR DILATING TIP 12MM 150MM (ENDOMECHANICALS) ×6 IMPLANT
TROCAR DISP BLADELESS 8 DVNC (TROCAR) ×5 IMPLANT
TROCAR DISP BLADELESS 8MM (TROCAR) ×1
TROCAR OPTI TIP 12M 100M (ENDOMECHANICALS) ×6 IMPLANT
TROCAR OPTI TIP 5M 100M (ENDOMECHANICALS) ×12 IMPLANT
TUBING FILTER THERMOFLATOR (ELECTROSURGICAL) ×6 IMPLANT
WARMER LAPAROSCOPE (MISCELLANEOUS) ×6 IMPLANT
WATER STERILE IRR 1000ML POUR (IV SOLUTION) ×18 IMPLANT

## 2013-03-15 NOTE — Anesthesia Preprocedure Evaluation (Signed)
Anesthesia Evaluation  Patient identified by MRN, date of birth, ID band Patient awake    Reviewed: Allergy & Precautions, H&P , NPO status , Patient's Chart, lab work & pertinent test results  Airway Mallampati: I TM Distance: >3 FB Neck ROM: full    Dental no notable dental hx. (+) Teeth Intact   Pulmonary neg pulmonary ROS,    Pulmonary exam normal       Cardiovascular negative cardio ROS      Neuro/Psych negative psych ROS   GI/Hepatic negative GI ROS, Neg liver ROS,   Endo/Other  diabetes, Well Controlled, Type 1, Insulin DependentHypothyroidism   Renal/GU negative Renal ROS  negative genitourinary   Musculoskeletal negative musculoskeletal ROS (+)   Abdominal Normal abdominal exam  (+)   Peds negative pediatric ROS (+)  Hematology negative hematology ROS (+)   Anesthesia Other Findings   Reproductive/Obstetrics negative OB ROS                           Anesthesia Physical Anesthesia Plan  ASA: II  Anesthesia Plan: General   Post-op Pain Management:    Induction: Intravenous  Airway Management Planned: Oral ETT  Additional Equipment:   Intra-op Plan:   Post-operative Plan: Extubation in OR  Informed Consent: I have reviewed the patients History and Physical, chart, labs and discussed the procedure including the risks, benefits and alternatives for the proposed anesthesia with the patient or authorized representative who has indicated his/her understanding and acceptance.   Dental Advisory Given  Plan Discussed with: CRNA and Surgeon  Anesthesia Plan Comments:         Anesthesia Quick Evaluation

## 2013-03-15 NOTE — Anesthesia Postprocedure Evaluation (Signed)
Anesthesia Post Note  Patient: Monica Lowery  Procedure(s) Performed: Procedure(s) (LRB): SALPINGO OOPHORECTOMY (Left) ROBOTIC ASSISTED LAPAROSCOPIC LYSIS OF ADHESION (N/A) UNILATERAL SALPINGECTOMY (Right) OVARIAN CYSTECTOMY (Right)  Anesthesia type: General  Patient location: PACU  Post pain: Pain level controlled  Post assessment: Post-op Vital signs reviewed  Last Vitals:  Filed Vitals:   03/15/13 1815  BP: 120/59  Pulse:   Temp:   Resp:     Post vital signs: Reviewed  Level of consciousness: sedated  Complications: No apparent anesthesia complications

## 2013-03-15 NOTE — Transfer of Care (Signed)
Immediate Anesthesia Transfer of Care Note  Patient: Monica Lowery  Procedure(s) Performed: Procedure(s): SALPINGO OOPHORECTOMY (Left) ROBOTIC ASSISTED LAPAROSCOPIC LYSIS OF ADHESION (N/A) UNILATERAL SALPINGECTOMY (Right) OVARIAN CYSTECTOMY (Right)  Patient Location: PACU  Anesthesia Type:General  Level of Consciousness: awake, alert  and oriented  Airway & Oxygen Therapy: Patient Spontanous Breathing and Patient connected to nasal cannula oxygen  Post-op Assessment: Report given to PACU RN and Post -op Vital signs reviewed and stable  Post vital signs: Reviewed and stable  Complications: No apparent anesthesia complications

## 2013-03-15 NOTE — Brief Op Note (Signed)
03/15/2013  5:58 PM  PATIENT:  Monica Lowery  42 y.o. female  PRE-OPERATIVE DIAGNOSIS:  Left complex ovarian cyst, History of total vaginal hysterectomy    POST-OPERATIVE DIAGNOSIS:  Left complex ovarian cyst, History of total vaginal hysterectomy, right ovarian cyst, pelvic adhesions    PROCEDURE:  Procedure(s): SALPINGO OOPHORECTOMY (Left) ROBOTIC ASSISTED LAPAROSCOPIC LYSIS OF ADHESION (N/A) UNILATERAL SALPINGECTOMY (Right) OVARIAN CYSTECTOMY (Right)  SURGEON:  Surgeon(s) and Role:    * Serita Kyle, MD - Primary  PHYSICIAN ASSISTANT:   ASSISTANTS: Marlinda Mike CNM   ANESTHESIA:   general Findings; pelvic adhesions( extensive). Ovaries bridged together. Right ovarian cyst, left tube with adhesions, right tube not initially seen , found ant to vaginal cuff buried in adhesion, ureters bilaterally peristalsing, appendix with some adhesion uterus surgical absent EBL:  Total I/O In: 2200 [I.V.:2200] Out: 500 [Urine:200; Blood:300]  BLOOD ADMINISTERED:none  DRAINS: none   LOCAL MEDICATIONS USED:  MARCAINE     SPECIMEN:  Source of Specimen:  left tube and ovary, right ovarian cyst wall, right tube  DISPOSITION OF SPECIMEN:  PATHOLOGY  COUNTS:  YES  TOURNIQUET:  * No tourniquets in log *  DICTATION: .Other Dictation: Dictation Number [id number]  PLAN OF CARE: Discharge to home after PACU  PATIENT DISPOSITION:  PACU - hemodynamically stable.   Delay start of Pharmacological VTE agent (>24hrs) due to surgical blood loss or risk of bleeding: no

## 2013-03-16 ENCOUNTER — Encounter (HOSPITAL_COMMUNITY): Payer: Self-pay | Admitting: Obstetrics and Gynecology

## 2013-03-16 LAB — GLUCOSE, CAPILLARY: Glucose-Capillary: 206 mg/dL — ABNORMAL HIGH (ref 70–99)

## 2013-03-17 LAB — GLUCOSE, CAPILLARY: Glucose-Capillary: 300 mg/dL — ABNORMAL HIGH (ref 70–99)

## 2013-03-17 NOTE — Op Note (Signed)
Monica Lowery, Monica Lowery           ACCOUNT NO.:  [id number]  MEDICAL RECORD NO.:  [id number]  LOCATION:  [facility]                          FACILITY:  [facility]  PHYSICIAN:  Maxie Better, M.D.DATE OF BIRTH:  02-Nov-1971  DATE OF PROCEDURE:  03/15/2013 DATE OF DISCHARGE:  03/15/2013                              OPERATIVE REPORT   PREOPERATIVE DIAGNOSES:  Persistent left complex ovarian cyst, previous hysterectomy.  POSTOPERATIVE DIAGNOSIS:  Complex left ovarian cyst, right ovarian cyst, history of hysterectomy, pelvic adhesions.  PROCEDURE:  Da Vinci robotic-assisted lysis of adhesions, right salpingectomy, right ovarian cystectomy, left salpingo-oophorectomy.  ANESTHESIA:  General.  SURGEON:  Maxie Better, M.D.  ASSISTANT:  None.  PROCEDURE:  Under adequate general anesthesia, the patient was placed in a dorsal lithotomy position.  She was sterilely prepped and draped in usual fashion.  A ring clamp was placed in the vagina for manipulation of the vaginal cuff.  Indwelling Foley catheter was sterilely placed. The patient was positioned for robotic surgery.  Attention was then turned to the abdomen.  The patient had a previous umbilical hernia repair without mesh.  0.25% Marcaine was injected along the previous scar.  Incision was then made.  The Veress needle was introduced, 3 L of CO2 was insufflated.  Veress needle was then removed.  Incision was extended and the trocar with sleeve was introduced into the abdomen without incident.  The panoramic inspection showed normal liver edge. Appendix has a small area of adhesions to the right lateral sidewall. Attention was turned to the pelvis where there was evidence of a left fallopian tube.  The right tube was not seen.  The right ureter was seen  peristalsing.  A 12-mm trocar with sleeve was introduced into the abdomen without incident.  The lighted robotic camera  was then inserted. Panoramic inspection done with normal liver  edge was noted.  The appendix was in the sidewall as previously stated.  The left lower quadrant  8 mm robotic port site was placed and a probe was then used to inspect the pelvis.  The patient had  hysterectomy in the past.  On inspection, there was no delineation between the right and left ovary( appearing fused. There was adhesions in the posterior cul-de-sac around the left tube as well as right tube.  Decision was then made to proceed with da Vinci robotic surgery and additional ports sites were placed 10 cm from the camera port site on the left and in the right lower quadrant, robotic 8 mm port site was placed.  Once this was done, the 5-mm assistant port was placed in between the camera site and the right lower quadrant site.  The robot was docked to the patient's left side.  PK dissector #2 was done.  Monopolar scissors in arm # 1 and the Prograsp was in #3.  I then went to the surgical console.  At the surgical console, attention was then turned to the pelvis, however we started to notice some bleeding that appeared tobe possibly coming from the umbilical incision site.  The #1 arm was undocked, the 8 mm robotic camera was placed in the site to inspect the umbilical site.  The pelvis was irrigated, continued bleeding  was noted at that point.  I left the console, the robotic camera port site was undocked and the trocar removed. Bleeding within the port site was noted.The fascia was identified and the bleeder noted and cauterized. The other robotic camera was then utilized to inspect the umbilical site and the bleeding had stopped. The Ports were then reinserted and the robot arms re docked and the instruments reinserted.  The operative instruments in the abdominal cavity had been removed prior to addressing this bleeding. I went back to the console. The pelvis was irrigated.  The pelvis had some areas of adhesions which were easily lysed.  Delineation between the right and left ovary was not  noted.  Lysing adhesions around both ovaries showed evidence of a cyst on the right ovary, which was located inferior to the right ovary.  The left ovary appeared to be multicystic. Reinspection showed that there was adhesions to the bowel to the left pelvic sidewall, bladder and peritoneal adhesions; all of these were lysed.  The fallopian tube on the left was taken off of the left ovary. Lysis of adhesions  anterior to the vaginal cuff showed evidence of the right fallopian fimbriated end. The right tube was also carefully removed.  Continued dissection by taking down the adhesions in the posterior cul-de-sac, ovaries off the bowels, peritoneal areas were done.  Once as much of the anatomy that could be identified was in place and the right fallopian tube was removed after serial clamping, cauterize and cutting the mesosalpinx.  Ureter on the left was deep in the pelvis.  Left tube and ovary was removed by clamping, cut, and cauterized from the left round ligament side.  The left ovary was finally freed up some more, the right ovary was freed up as well. The right ovarian cyst was removed. A vertical incision in the midline of what was considered possible area of the ovaries joining thus separating the left side from the right ovary.  Small bleeders again were cauterized. The right ovary had good hemostasis. The pelvis was irrigated.  The right ovarian cyst was removed as well as the right tube. The robotic instruments were removed. The robot was subsequently undocked.  The left tube and ovary specimen was brought through the umbilical port. The abdomen was then copiously irrigated.  Small bleeders cauterized. The robotic ports removed under direct visualization. The fascia from the umbilical site was closed with interrupted O-Vicryl sutures. The skin incisions closed with 4-0 vicryl subcuticular stitches and dermabond placed.  SPECIMEN:  Left tube and ovary, right ovarian cyst wall and right  fallopian tube all sent to Pathology.  ESTIMATED BLOOD LOSS:  At least 300 mL.  FLUID:  1.2 L.  URINE OUTPUT:  200 mL of urine.  Sponge and instrument counts x2 was correct.  COMPLICATIONS:  None.  The patient tolerated the procedure well, was transferred to the recovery room in stable condition.     Maxie Better, M.D.     [place]/MEDQ  D:  03/15/2013  T:  03/16/2013  Job:  [id number]

## 2013-04-06 ENCOUNTER — Ambulatory Visit: Payer: Self-pay | Admitting: Neurology

## 2014-07-13 ENCOUNTER — Other Ambulatory Visit (HOSPITAL_COMMUNITY): Payer: Self-pay | Admitting: Obstetrics and Gynecology

## 2014-07-13 ENCOUNTER — Other Ambulatory Visit (HOSPITAL_COMMUNITY): Payer: Self-pay | Admitting: Pulmonary Disease

## 2014-07-13 DIAGNOSIS — R1032 Left lower quadrant pain: Secondary | ICD-10-CM

## 2014-07-13 DIAGNOSIS — Z139 Encounter for screening, unspecified: Secondary | ICD-10-CM

## 2014-07-16 ENCOUNTER — Ambulatory Visit (HOSPITAL_COMMUNITY): Payer: BC Managed Care – PPO

## 2014-07-17 ENCOUNTER — Ambulatory Visit (HOSPITAL_COMMUNITY)
Admission: RE | Admit: 2014-07-17 | Discharge: 2014-07-17 | Disposition: A | Discharge status: Home / Self Care | Payer: BC Managed Care – PPO | Source: Ambulatory Visit | Attending: Pulmonary Disease | Admitting: Pulmonary Disease

## 2014-07-17 DIAGNOSIS — R1032 Left lower quadrant pain: Secondary | ICD-10-CM | POA: Diagnosis present

## 2014-07-17 DIAGNOSIS — K7689 Other specified diseases of liver: Secondary | ICD-10-CM | POA: Insufficient documentation

## 2014-07-17 LAB — POCT I-STAT CREATININE: Creatinine, Ser: 0.8 mg/dL (ref 0.50–1.10)

## 2014-07-17 MED ORDER — IOHEXOL 300 MG/ML  SOLN
100.0000 mL | Freq: Once | INTRAMUSCULAR | Status: AC | PRN
  Administered 2014-07-17: 100 mL via INTRAVENOUS

## 2014-07-19 ENCOUNTER — Ambulatory Visit (HOSPITAL_COMMUNITY): Payer: BC Managed Care – PPO

## 2014-07-20 ENCOUNTER — Ambulatory Visit (HOSPITAL_COMMUNITY)
Admission: RE | Admit: 2014-07-20 | Discharge: 2014-07-20 | Disposition: A | Discharge status: Home / Self Care | Payer: BC Managed Care – PPO | Source: Ambulatory Visit | Attending: Obstetrics and Gynecology | Admitting: Obstetrics and Gynecology

## 2014-07-20 DIAGNOSIS — Z1231 Encounter for screening mammogram for malignant neoplasm of breast: Secondary | ICD-10-CM | POA: Insufficient documentation

## 2014-07-20 DIAGNOSIS — Z139 Encounter for screening, unspecified: Secondary | ICD-10-CM

## 2015-07-29 ENCOUNTER — Encounter: Payer: Self-pay | Admitting: Internal Medicine

## 2015-12-17 ENCOUNTER — Emergency Department (HOSPITAL_COMMUNITY)
Admission: EM | Admit: 2015-12-17 | Discharge: 2015-12-17 | Disposition: A | Discharge status: Home / Self Care | Payer: BC Managed Care – PPO | Attending: Emergency Medicine | Admitting: Emergency Medicine

## 2015-12-17 ENCOUNTER — Other Ambulatory Visit (HOSPITAL_COMMUNITY): Payer: Self-pay | Admitting: Pulmonary Disease

## 2015-12-17 ENCOUNTER — Encounter (HOSPITAL_COMMUNITY): Payer: Self-pay | Admitting: *Deleted

## 2015-12-17 ENCOUNTER — Emergency Department (HOSPITAL_COMMUNITY): Payer: BC Managed Care – PPO

## 2015-12-17 DIAGNOSIS — R112 Nausea with vomiting, unspecified: Secondary | ICD-10-CM | POA: Diagnosis not present

## 2015-12-17 DIAGNOSIS — Z794 Long term (current) use of insulin: Secondary | ICD-10-CM | POA: Insufficient documentation

## 2015-12-17 DIAGNOSIS — Z7982 Long term (current) use of aspirin: Secondary | ICD-10-CM | POA: Insufficient documentation

## 2015-12-17 DIAGNOSIS — E039 Hypothyroidism, unspecified: Secondary | ICD-10-CM | POA: Insufficient documentation

## 2015-12-17 DIAGNOSIS — R1010 Upper abdominal pain, unspecified: Secondary | ICD-10-CM | POA: Diagnosis not present

## 2015-12-17 DIAGNOSIS — E119 Type 2 diabetes mellitus without complications: Secondary | ICD-10-CM | POA: Diagnosis not present

## 2015-12-17 DIAGNOSIS — R1011 Right upper quadrant pain: Secondary | ICD-10-CM

## 2015-12-17 DIAGNOSIS — Z79899 Other long term (current) drug therapy: Secondary | ICD-10-CM | POA: Insufficient documentation

## 2015-12-17 DIAGNOSIS — K828 Other specified diseases of gallbladder: Secondary | ICD-10-CM | POA: Insufficient documentation

## 2015-12-17 DIAGNOSIS — R911 Solitary pulmonary nodule: Secondary | ICD-10-CM | POA: Insufficient documentation

## 2015-12-17 DIAGNOSIS — R011 Cardiac murmur, unspecified: Secondary | ICD-10-CM | POA: Insufficient documentation

## 2015-12-17 DIAGNOSIS — R140 Abdominal distension (gaseous): Secondary | ICD-10-CM | POA: Diagnosis not present

## 2015-12-17 DIAGNOSIS — R1013 Epigastric pain: Secondary | ICD-10-CM | POA: Diagnosis present

## 2015-12-17 DIAGNOSIS — K7689 Other specified diseases of liver: Secondary | ICD-10-CM

## 2015-12-17 HISTORY — DX: Irritable bowel syndrome, unspecified: K58.9

## 2015-12-17 LAB — CBC WITH DIFFERENTIAL/PLATELET
BASOS ABS: 0 10*3/uL (ref 0.0–0.1)
BASOS PCT: 0 %
EOS PCT: 2 %
Eosinophils Absolute: 0.1 10*3/uL (ref 0.0–0.7)
HCT: 36.2 % (ref 36.0–46.0)
Hemoglobin: 11.8 g/dL — ABNORMAL LOW (ref 12.0–15.0)
Lymphocytes Relative: 24 %
Lymphs Abs: 1.4 10*3/uL (ref 0.7–4.0)
MCH: 28.6 pg (ref 26.0–34.0)
MCHC: 32.6 g/dL (ref 30.0–36.0)
MCV: 87.9 fL (ref 78.0–100.0)
MONO ABS: 0.3 10*3/uL (ref 0.1–1.0)
Monocytes Relative: 4 %
Neutro Abs: 4.1 10*3/uL (ref 1.7–7.7)
Neutrophils Relative %: 70 %
PLATELETS: 259 10*3/uL (ref 150–400)
RBC: 4.12 MIL/uL (ref 3.87–5.11)
RDW: 11.8 % (ref 11.5–15.5)
WBC: 5.9 10*3/uL (ref 4.0–10.5)

## 2015-12-17 LAB — COMPREHENSIVE METABOLIC PANEL
ALT: 15 U/L (ref 14–54)
AST: 19 U/L (ref 15–41)
Albumin: 3.5 g/dL (ref 3.5–5.0)
Alkaline Phosphatase: 88 U/L (ref 38–126)
Anion gap: 8 (ref 5–15)
BILIRUBIN TOTAL: 0.5 mg/dL (ref 0.3–1.2)
BUN: 12 mg/dL (ref 6–20)
CO2: 26 mmol/L (ref 22–32)
CREATININE: 0.62 mg/dL (ref 0.44–1.00)
Calcium: 8.7 mg/dL — ABNORMAL LOW (ref 8.9–10.3)
Chloride: 105 mmol/L (ref 101–111)
GFR calc Af Amer: >60 mL/min (ref >60)
Glucose, Bld: 115 mg/dL — ABNORMAL HIGH (ref 65–99)
Potassium: 3.5 mmol/L (ref 3.5–5.1)
Sodium: 139 mmol/L (ref 135–145)
TOTAL PROTEIN: 6.7 g/dL (ref 6.5–8.1)

## 2015-12-17 LAB — LIPASE, BLOOD: LIPASE: 30 U/L (ref 11–51)

## 2015-12-17 MED ORDER — ONDANSETRON HCL 4 MG/2ML IJ SOLN
4.0000 mg | Freq: Once | INTRAMUSCULAR | Status: AC
  Administered 2015-12-17: 4 mg via INTRAVENOUS
  Filled 2015-12-17: qty 2

## 2015-12-17 MED ORDER — SODIUM CHLORIDE 0.9 % IV SOLN
1000.0000 mL | Freq: Once | INTRAVENOUS | Status: AC
  Administered 2015-12-17: 1000 mL via INTRAVENOUS

## 2015-12-17 MED ORDER — HYDROCODONE-ACETAMINOPHEN 5-325 MG PO TABS: 1.0000 | ORAL_TABLET | Freq: Four times a day (QID) | ORAL | Status: DC | PRN

## 2015-12-17 MED ORDER — SODIUM CHLORIDE 0.9 % IV SOLN
1000.0000 mL | INTRAVENOUS | Status: DC
  Administered 2015-12-17: 1000 mL via INTRAVENOUS

## 2015-12-17 MED ORDER — ONDANSETRON HCL 4 MG PO TABS: 4.0000 mg | ORAL_TABLET | Freq: Three times a day (TID) | ORAL | Status: DC | PRN

## 2015-12-17 MED ORDER — FENTANYL CITRATE (PF) 100 MCG/2ML IJ SOLN
50.0000 ug | Freq: Once | INTRAMUSCULAR | Status: AC
Start: 2015-12-17 — End: 2015-12-17
  Administered 2015-12-17: 50 ug via INTRAVENOUS
  Filled 2015-12-17: qty 2

## 2015-12-17 MED ORDER — FAMOTIDINE IN NACL 20-0.9 MG/50ML-% IV SOLN
20.0000 mg | Freq: Once | INTRAVENOUS | Status: AC
  Administered 2015-12-17: 20 mg via INTRAVENOUS
  Filled 2015-12-17: qty 50

## 2015-12-17 MED ORDER — FENTANYL CITRATE (PF) 100 MCG/2ML IJ SOLN
50.0000 ug | Freq: Once | INTRAMUSCULAR | Status: AC
  Administered 2015-12-17: 50 ug via INTRAVENOUS
  Filled 2015-12-17: qty 2

## 2015-12-17 MED ORDER — OMEPRAZOLE 20 MG PO CPDR: DELAYED_RELEASE_CAPSULE | ORAL | Status: AC

## 2015-12-17 NOTE — Discharge Instructions (Signed)
Avoid fried, spicy, or greasy foods for the next couple weeks. Take the Prilosec as directed. Uses Zofran for nausea or vomiting. If you do not have gallstones you can follow-up with your primary care doctor or Dr. Karilyn Cotaehman, the gastroenterologist (stomach specialist) on call.  If you do have gallstones call Dr. Franky MachoMark Jenkins office to get an appointment to discuss having her gallbladder removed. He is a Development worker, international aidgeneral surgeon.  Return to the ED if you get a fever, have uncontrolled vomiting, or your pain gets worse.   Nausea and Vomiting Nausea means you feel sick to your stomach. Throwing up (vomiting) is a reflex where stomach contents come out of your mouth. HOME CARE   Take medicine as told by your doctor.  Do not force yourself to eat. However, you do need to drink fluids.  If you feel like eating, eat a normal diet as told by your doctor.  Eat rice, wheat, potatoes, bread, lean meats, yogurt, fruits, and vegetables.  Avoid high-fat foods.  Drink enough fluids to keep your pee (urine) clear or pale yellow.  Ask your doctor how to replace body fluid losses (rehydrate). Signs of body fluid loss (dehydration) include:  Feeling very thirsty.  Dry lips and mouth.  Feeling dizzy.  Dark pee.  Peeing less than normal.  Feeling confused.  Fast breathing or heart rate. GET HELP RIGHT AWAY IF:   You have blood in your throw up.  You have black or bloody poop (stool).  You have a bad headache or stiff neck.  You feel confused.  You have bad belly (abdominal) pain.  You have chest pain or trouble breathing.  You do not pee at least once every 8 hours.  You have cold, clammy skin.  You keep throwing up after 24 to 48 hours.  You have a fever. MAKE SURE YOU:   Understand these instructions.  Will watch your condition.  Will get help right away if you are not doing well or get worse.   This information is not intended to replace advice given to you by your health care  provider. Make sure you discuss any questions you have with your health care provider.   Document Released: 05/11/2008 Document Revised: 02/15/2012 Document Reviewed: 04/24/2011 Elsevier Interactive Patient Education [employer]2016 Elsevier Inc.

## 2015-12-17 NOTE — ED Notes (Signed)
MD at bedside. 

## 2015-12-17 NOTE — ED Notes (Signed)
Pt reporting generalized abdominal pain. Reports associated nausea, vomiting and bloated feeling.  Denies problems with diarrhea, doesn't believe she is constipated. Symptoms since Saturday.

## 2015-12-17 NOTE — ED Provider Notes (Signed)
CSN: [id number]     Arrival date & time 12/17/15  0350 History   First MD Initiated Contact with Patient 12/17/15 0405    Chief Complaint  Patient presents with  . Abdominal Pain     (Consider location/radiation/quality/duration/timing/severity/associated sxs/prior Treatment) HPI January 7. She reports she's had nausea and vomiting about 5-6 times a day. She denies seeing blood but states at times it is bile in color. She states she's having epigastric bloating and discomfort. She states she's having a lot of belching and passing off flatus. She also has some burning lower low-back pain. She denies diarrhea and states she's been on Linzess. She states her last bowel movement was also on the seventh and it was normal, it was not hard or like balls. She states the first day she had fever of 101. Since then she has felt hot and cold but no further documented fever. She states tonight she feels dizzy and lightheaded. She also complains of decreased appetite.  Patient states she had a colonoscopy done on December 21 for constipation and blood in her stools and she was told it was totally normal and they thought the blood was from possible hemorrhoids.   PCP Dr Juanetta GoslingHawkins GI Dr Loreta AveMann  Past Medical History  Diagnosis Date  . Diabetes mellitus   . Thyroid disease   . Heart murmur     "slight"  . Leaky heart valve     per pt "very small"  . Dysrhythmia     occasional palpitations  . Hypothyroidism   . Liver nodule     focal nodule hyperplasia  . Headache(784.0)   . PONV (postoperative nausea and vomiting)   . Irritable bowel syndrome (IBS)    Past Surgical History  Procedure Laterality Date  . Partial hysterectomy  2001    uterine prolapse, fibroids  . Shoulder surgery  2012    left  . Tubal ligation  2001  . Salpingoophorectomy Left 03/15/2013    Procedure: SALPINGO OOPHORECTOMY;  Surgeon: Serita KyleSheronette A Cousins, MD;  Location: [facility];  Service: Gynecology;  Laterality: Left;  . Robotic  assisted laparoscopic lysis of adhesion N/A 03/15/2013    Procedure: ROBOTIC ASSISTED LAPAROSCOPIC LYSIS OF ADHESION;  Surgeon: Serita KyleSheronette A Cousins, MD;  Location: [facility];  Service: Gynecology;  Laterality: N/A;  . Unilateral salpingectomy Right 03/15/2013    Procedure: UNILATERAL SALPINGECTOMY;  Surgeon: Serita KyleSheronette A Cousins, MD;  Location: [facility];  Service: Gynecology;  Laterality: Right;  . Ovarian cyst removal Right 03/15/2013    Procedure: OVARIAN CYSTECTOMY;  Surgeon: Serita KyleSheronette A Cousins, MD;  Location: [facility];  Service: Gynecology;  Laterality: Right;   History reviewed. No pertinent family history. Social History  Substance Use Topics  . Smoking status: Never Smoker   . Smokeless tobacco: None  . Alcohol Use: No   Employed in the school system  OB History    No data available     Review of Systems  All other systems reviewed and are negative.     Allergies  Review of patient's allergies indicates no known allergies.  Home Medications   Prior to Admission medications   Medication Sig Start Date End Date Taking? Authorizing Provider  Linaclotide (LINZESS) 145 MCG CAPS capsule Take 145 mcg by mouth daily.   Yes Historical Provider, MD  acetaminophen (OFIRMEV) 10 MG/ML SOLN Inject 100 mLs (1,000 mg total) into the vein once. 03/15/13   Maxie BetterSheronette Cousins, MD  aspirin 81 MG tablet Take 81 mg by mouth daily  as needed.     Historical Provider, MD  cetirizine (ZYRTEC) 10 MG tablet Take 10 mg by mouth daily.    Historical Provider, MD  clobetasol cream (TEMOVATE) 0.05 % Apply 1 application topically daily as needed (for diabetic yeast rash itching).    Historical Provider, MD  HYDROcodone-acetaminophen (NORCO/VICODIN) 5-325 MG tablet Take 1 tablet by mouth every 6 (six) hours as needed for moderate pain. 12/17/15   Devoria Albe, MD  hydrocodone-ibuprofen (VICOPROFEN) 5-200 MG per tablet Take 1 tablet by mouth every 8 (eight) hours as needed for pain. 03/15/13   Sheronette Cousins, MD    insulin aspart (NOVOLOG) 100 UNIT/ML injection Inject 1-10 Units into the skin 3 (three) times daily before meals. Based on carb ratio    Historical Provider, MD  LANTUS 100 UNIT/ML injection Inject 30 Units into the skin at bedtime.  08/06/12   Historical Provider, MD  levothyroxine (SYNTHROID, LEVOTHROID) 175 MCG tablet Take 175 mcg by mouth daily.  07/07/12   Historical Provider, MD  nystatin cream (MYCOSTATIN) Apply 1 application topically 2 (two) times daily as needed (yeast infection).    Historical Provider, MD  omeprazole (PRILOSEC) 20 MG capsule Take 1 po BID x 2 weeks then once a day 12/17/15   Devoria Albe, MD  ondansetron (ZOFRAN) 4 MG tablet Take 1 tablet (4 mg total) by mouth every 8 (eight) hours as needed for nausea or vomiting. 12/17/15   Devoria Albe, MD  ONE TOUCH ULTRA TEST test strip  08/30/12   Historical Provider, MD   BP 136/88 mmHg  Pulse 87  Temp(Src) 97.8 F (36.6 C) (Oral)  Resp 18  Ht 5\' 5"  (1.651 m)  Wt 180 lb (81.647 kg)  BMI 29.95 kg/m2  SpO2 97%  Vital signs normal   Physical Exam  Constitutional: She is oriented to person, place, and time. She appears well-developed and well-nourished.  Non-toxic appearance. She does not appear ill. No distress.  HENT:  Head: Normocephalic and atraumatic.  Right Ear: External ear normal.  Left Ear: External ear normal.  Nose: Nose normal. No mucosal edema or rhinorrhea.  Mouth/Throat: Oropharynx is clear and moist and mucous membranes are normal. No dental abscesses or uvula swelling.  Eyes: Conjunctivae and EOM are normal. Pupils are equal, round, and reactive to light.  Neck: Normal range of motion and full passive range of motion without pain. Neck supple.  Cardiovascular: Normal rate, regular rhythm and normal heart sounds.  Exam reveals no gallop and no friction rub.   No murmur heard. Pulmonary/Chest: Effort normal and breath sounds normal. No respiratory distress. She has no wheezes. She has no rhonchi. She has no rales.  She exhibits no tenderness and no crepitus.  Abdominal: Soft. Normal appearance and bowel sounds are normal. She exhibits no distension. There is tenderness. There is no rebound and no guarding.    Patient is very tender in the epigastric and right upper quadrant, less so in the left upper quadrant. She does not have any obvious bloating.  Musculoskeletal: Normal range of motion. She exhibits no edema or tenderness.  Moves all extremities well.   Neurological: She is alert and oriented to person, place, and time. She has normal strength. No cranial nerve deficit.  Skin: Skin is warm, dry and intact. No rash noted. No erythema. No pallor.  Psychiatric: She has a normal mood and affect. Her speech is normal and behavior is normal. Her mood appears not anxious.  Nursing note and vitals reviewed.   ED  Course  Procedures (including critical care time)  Medications  0.9 %  sodium chloride infusion (0 mLs Intravenous Stopped 12/17/15 0547)    Followed by  0.9 %  sodium chloride infusion (1,000 mLs Intravenous New Bag/Given 12/17/15 0548)    Followed by  0.9 %  sodium chloride infusion (not administered)  fentaNYL (SUBLIMAZE) injection 50 mcg (50 mcg Intravenous Given 12/17/15 0443)  ondansetron (ZOFRAN) injection 4 mg (4 mg Intravenous Given 12/17/15 0443)  famotidine (PEPCID) IVPB 20 mg premix (0 mg Intravenous Stopped 12/17/15 0546)    Patient was given IV fluids and IV fentanyl and Zofran for pain and nausea and she was given IV Pepcid due to her epigastric pain.  5:35 AM patient was given her test results. She states her nausea is gone. Her pain is better. We discussed getting a gallbladder ultrasound and she was given the option of waiting until 7:30 this morning to have it done work to come back with an appointment and she has elected to stay.   Patient left the change of shift with Dr Jodi Mourning to get results of her ultrasound.  Labs Review Results for orders placed or performed during the  hospital encounter of 12/17/15  Comprehensive metabolic panel  Result Value Ref Range   Sodium 139 135 - 145 mmol/L   Potassium 3.5 3.5 - 5.1 mmol/L   Chloride 105 101 - 111 mmol/L   CO2 26 22 - 32 mmol/L   Glucose, Bld 115 (H) 65 - 99 mg/dL   BUN 12 6 - 20 mg/dL   Creatinine, Ser 9.60 0.44 - 1.00 mg/dL   Calcium 8.7 (L) 8.9 - 10.3 mg/dL   Total Protein 6.7 6.5 - 8.1 g/dL   Albumin 3.5 3.5 - 5.0 g/dL   AST 19 15 - 41 U/L   ALT 15 14 - 54 U/L   Alkaline Phosphatase 88 38 - 126 U/L   Total Bilirubin 0.5 0.3 - 1.2 mg/dL   GFR calc non Af Amer >60 >60 mL/min   GFR calc Af Amer >60 >60 mL/min   Anion gap 8 5 - 15  Lipase, blood  Result Value Ref Range   Lipase 30 11 - 51 U/L  CBC with Differential  Result Value Ref Range   WBC 5.9 4.0 - 10.5 K/uL   RBC 4.12 3.87 - 5.11 MIL/uL   Hemoglobin 11.8 (L) 12.0 - 15.0 g/dL   HCT 45.4 09.8 - 11.9 %   MCV 87.9 78.0 - 100.0 fL   MCH 28.6 26.0 - 34.0 pg   MCHC 32.6 30.0 - 36.0 g/dL   RDW 14.7 82.9 - 56.2 %   Platelets 259 150 - 400 K/uL   Neutrophils Relative % 70 %   Neutro Abs 4.1 1.7 - 7.7 K/uL   Lymphocytes Relative 24 %   Lymphs Abs 1.4 0.7 - 4.0 K/uL   Monocytes Relative 4 %   Monocytes Absolute 0.3 0.1 - 1.0 K/uL   Eosinophils Relative 2 %   Eosinophils Absolute 0.1 0.0 - 0.7 K/uL   Basophils Relative 0 %   Basophils Absolute 0.0 0.0 - 0.1 K/uL   Laboratory interpretation all normal except mild anemia     Imaging Review No results found. I have personally reviewed and evaluated these images and lab results as part of my medical decision-making.   EKG Interpretation None      MDM   Final diagnoses:  Upper abdominal pain  Abdominal bloating  Nausea and vomiting, vomiting of unspecified  type   Disposition pending result of [place]  Devoria Albe, MD, Concha Pyo, MD 12/17/15 [phone]

## 2015-12-18 ENCOUNTER — Encounter (HOSPITAL_COMMUNITY): Payer: Self-pay

## 2015-12-18 ENCOUNTER — Encounter (HOSPITAL_COMMUNITY)
Admission: RE | Admit: 2015-12-18 | Discharge: 2015-12-18 | Disposition: A | Discharge status: Home / Self Care | Payer: BC Managed Care – PPO | Source: Ambulatory Visit | Attending: Pulmonary Disease | Admitting: Pulmonary Disease

## 2015-12-18 ENCOUNTER — Encounter (HOSPITAL_COMMUNITY): Payer: BC Managed Care – PPO

## 2015-12-18 DIAGNOSIS — R1011 Right upper quadrant pain: Secondary | ICD-10-CM | POA: Insufficient documentation

## 2015-12-18 MED ORDER — TECHNETIUM TC 99M MEBROFENIN IV KIT
5.0000 | PACK | Freq: Once | INTRAVENOUS | Status: AC | PRN
  Administered 2015-12-18: 5.2 via INTRAVENOUS

## 2015-12-18 MED ORDER — SINCALIDE 5 MCG IJ SOLR
INTRAMUSCULAR | Status: AC
  Administered 2015-12-18: 1.64 ug
  Filled 2015-12-18: qty 5

## 2015-12-18 MED ORDER — STERILE WATER FOR INJECTION IJ SOLN
INTRAMUSCULAR | Status: AC
  Administered 2015-12-18: 1.64 mL
  Filled 2015-12-18: qty 10

## 2015-12-19 ENCOUNTER — Encounter (HOSPITAL_COMMUNITY)
Admission: RE | Admit: 2015-12-19 | Discharge: 2015-12-19 | Disposition: A | Discharge status: Home / Self Care | Payer: BC Managed Care – PPO | Source: Ambulatory Visit | Attending: General Surgery | Admitting: General Surgery

## 2015-12-19 ENCOUNTER — Encounter (HOSPITAL_COMMUNITY): Payer: Self-pay

## 2015-12-19 NOTE — H&P (Signed)
  [facility] SOAP Note  Vital Signs:  Vitals as of: 12/19/2015: Systolic 125: Diastolic 85: Heart Rate 82: Temp 98.28F (Temporal): Height 265ft 5.5in: Weight 180Lbs 0 Ounces: Pain Level 10: BMI 29.5   BMI : 29.5 kg/m2  Subjective: This 45 year old female presents for of biliary colic.  Has been having right upper quadrant abdominal pain with radiation to the right flank, nausea, vomiting, and food intolerance.  Is getting worse.  No fever, jaundice.  U/S unremarkable.  HIDA showed no gallbladder filling c/w cholecystitis.  Review of Symptoms:  Constitutional:unremarkable   Head:unremarkable Eyes:unremarkable   sinus problems Cardiovascular:  unremarkable Respiratory:unremarkable Gastrointestinabdominal pain, nausea, vomiting, heartburn Genitourinary:unremarkable   Musculoskeletal:unremarkable Skin:unremarkable Hematolgic/Lymphatic:unremarkable   Allergic/Immunologic:unremarkable   Past Medical History:  Reviewed  Past Medical History  Surgical History: BTL, TAH, oophorectomy left, shoulder surgery Medical Problems: IDDM, hypothyroidism Allergies: nkda Medications: insulin, levothyroxine, baby asa, linzess, zofran   Social History:Reviewed  Social History  Preferred Language: English Race:  Black or African American Ethnicity: Not Hispanic / Latino Age: [age] year Marital Status:  M Alcohol: no   Smoking Status: Never smoker reviewed on 12/19/2015 Functional Status reviewed on 12/19/2015 ------------------------------------------------ Bathing: Normal Cooking: Normal Dressing: Normal Driving: Normal Eating: Normal Managing Meds: Normal Oral Care: Normal Shopping: Normal Toileting: Normal Transferring: Normal Walking: Normal Cognitive Status reviewed on 12/19/2015 ------------------------------------------------ Attention: Normal Decision Making: Normal Language: Normal Memory: Normal Motor: Normal Perception: Normal Problem Solving:  Normal Visual and Spatial: Normal   Family History:Reviewed  Family Health History Mother, Healthy;  Father, Healthy;     Objective Information: General:Well appearing, well nourished in no distress. no scleral icterus Heart:RRR, no murmur or gallop.  Normal S1, S2.  No S3, S4.  Lungs:  CTA bilaterally, no wheezes, rhonchi, rales.  Breathing unlabored. Abdomen:Soft, tender in the right upper quadrant to palpation, ND, no HSM, no masses.  Assessment:Chronic cholecystitis  Diagnoses: 575.11  K81.1 Chronic cholecystitis (Chronic cholecystitis)  Procedures: [id number] - OFFICE OUTPATIENT NEW 30 MINUTES    Plan:  Scheduled for laparoscopic cholecystectomy on 12/20/15.   Patient Education:Alternative treatments to surgery were discussed with patient (and family).  Risks and benefits  of procedure including bleeding, infection, ehpatobiliary injury, and the possibility of an open procedure were fully explained to the patient (and family) who gave informed consent. Patient/family questions were addressed.  Follow-up:Pending Surgery

## 2015-12-20 ENCOUNTER — Ambulatory Visit (HOSPITAL_COMMUNITY): Payer: BC Managed Care – PPO | Admitting: Anesthesiology

## 2015-12-20 ENCOUNTER — Other Ambulatory Visit: Payer: Self-pay

## 2015-12-20 ENCOUNTER — Ambulatory Visit (HOSPITAL_COMMUNITY)
Admission: RE | Admit: 2015-12-20 | Discharge: 2015-12-20 | Disposition: A | Discharge status: Home / Self Care | Payer: BC Managed Care – PPO | Source: Ambulatory Visit | Attending: General Surgery | Admitting: General Surgery

## 2015-12-20 ENCOUNTER — Encounter (HOSPITAL_COMMUNITY): Payer: Self-pay | Admitting: *Deleted

## 2015-12-20 ENCOUNTER — Encounter (HOSPITAL_COMMUNITY)
Admission: RE | Disposition: A | Discharge status: Home / Self Care | Payer: Self-pay | Source: Ambulatory Visit | Attending: General Surgery

## 2015-12-20 DIAGNOSIS — R079 Chest pain, unspecified: Secondary | ICD-10-CM

## 2015-12-20 DIAGNOSIS — K811 Chronic cholecystitis: Secondary | ICD-10-CM | POA: Diagnosis not present

## 2015-12-20 DIAGNOSIS — Z01812 Encounter for preprocedural laboratory examination: Secondary | ICD-10-CM | POA: Diagnosis not present

## 2015-12-20 DIAGNOSIS — E039 Hypothyroidism, unspecified: Secondary | ICD-10-CM | POA: Insufficient documentation

## 2015-12-20 DIAGNOSIS — Z01810 Encounter for preprocedural cardiovascular examination: Secondary | ICD-10-CM | POA: Diagnosis not present

## 2015-12-20 DIAGNOSIS — Z79899 Other long term (current) drug therapy: Secondary | ICD-10-CM | POA: Diagnosis not present

## 2015-12-20 DIAGNOSIS — E119 Type 2 diabetes mellitus without complications: Secondary | ICD-10-CM | POA: Diagnosis not present

## 2015-12-20 DIAGNOSIS — Z7982 Long term (current) use of aspirin: Secondary | ICD-10-CM | POA: Diagnosis not present

## 2015-12-20 DIAGNOSIS — Z9889 Other specified postprocedural states: Secondary | ICD-10-CM | POA: Diagnosis not present

## 2015-12-20 DIAGNOSIS — Z794 Long term (current) use of insulin: Secondary | ICD-10-CM

## 2015-12-20 DIAGNOSIS — Z9049 Acquired absence of other specified parts of digestive tract: Secondary | ICD-10-CM | POA: Diagnosis not present

## 2015-12-20 HISTORY — DX: Unspecified asthma, uncomplicated: J45.909

## 2015-12-20 HISTORY — PX: CHOLECYSTECTOMY: SHX55

## 2015-12-20 LAB — GLUCOSE, CAPILLARY
GLUCOSE-CAPILLARY: 95 mg/dL (ref 65–99)
Glucose-Capillary: 135 mg/dL — ABNORMAL HIGH (ref 65–99)

## 2015-12-20 LAB — TROPONIN I

## 2015-12-20 SURGERY — LAPAROSCOPIC CHOLECYSTECTOMY
Anesthesia: General | Site: Abdomen

## 2015-12-20 MED ORDER — METOPROLOL TARTRATE 1 MG/ML IV SOLN
INTRAVENOUS | Status: AC
  Filled 2015-12-20: qty 5

## 2015-12-20 MED ORDER — GLYCOPYRROLATE 0.2 MG/ML IJ SOLN
INTRAMUSCULAR | Status: AC
  Filled 2015-12-20: qty 3

## 2015-12-20 MED ORDER — SUCCINYLCHOLINE CHLORIDE 20 MG/ML IJ SOLN
INTRAMUSCULAR | Status: AC
  Filled 2015-12-20: qty 1

## 2015-12-20 MED ORDER — ONDANSETRON HCL 4 MG/2ML IJ SOLN
4.0000 mg | Freq: Once | INTRAMUSCULAR | Status: AC
  Administered 2015-12-20: 4 mg via INTRAVENOUS

## 2015-12-20 MED ORDER — FENTANYL CITRATE (PF) 100 MCG/2ML IJ SOLN
INTRAMUSCULAR | Status: DC | PRN
  Administered 2015-12-20 (×2): 50 ug via INTRAVENOUS
  Administered 2015-12-20: 100 ug via INTRAVENOUS
  Administered 2015-12-20 (×2): 50 ug via INTRAVENOUS

## 2015-12-20 MED ORDER — POVIDONE-IODINE 10 % EX OINT
TOPICAL_OINTMENT | CUTANEOUS | Status: AC
  Filled 2015-12-20: qty 1

## 2015-12-20 MED ORDER — METOPROLOL TARTRATE 1 MG/ML IV SOLN
2.0000 mg | Freq: Once | INTRAVENOUS | Status: AC
Start: 2015-12-20 — End: 2015-12-20
  Administered 2015-12-20: 2.5 mg via INTRAVENOUS

## 2015-12-20 MED ORDER — SODIUM CHLORIDE 0.9 % IR SOLN
Status: DC | PRN
  Administered 2015-12-20: 1000 mL

## 2015-12-20 MED ORDER — LIDOCAINE HCL (PF) 1 % IJ SOLN
INTRAMUSCULAR | Status: AC
  Filled 2015-12-20: qty 5

## 2015-12-20 MED ORDER — SUCCINYLCHOLINE CHLORIDE 20 MG/ML IJ SOLN
INTRAMUSCULAR | Status: DC | PRN
  Administered 2015-12-20: 100 mg via INTRAVENOUS

## 2015-12-20 MED ORDER — CIPROFLOXACIN IN D5W 400 MG/200ML IV SOLN
INTRAVENOUS | Status: AC
  Filled 2015-12-20: qty 200

## 2015-12-20 MED ORDER — ONDANSETRON HCL 4 MG/2ML IJ SOLN
4.0000 mg | Freq: Once | INTRAMUSCULAR | Status: AC | PRN
  Administered 2015-12-20: 4 mg via INTRAVENOUS

## 2015-12-20 MED ORDER — FENTANYL CITRATE (PF) 100 MCG/2ML IJ SOLN
25.0000 ug | INTRAMUSCULAR | Status: DC | PRN
  Administered 2015-12-20 (×2): 25 ug via INTRAVENOUS

## 2015-12-20 MED ORDER — MIDAZOLAM HCL 2 MG/2ML IJ SOLN
INTRAMUSCULAR | Status: AC
  Filled 2015-12-20: qty 2

## 2015-12-20 MED ORDER — LIDOCAINE HCL (CARDIAC) 20 MG/ML IV SOLN
INTRAVENOUS | Status: DC | PRN
  Administered 2015-12-20: 40 mg via INTRATRACHEAL

## 2015-12-20 MED ORDER — OXYCODONE-ACETAMINOPHEN 7.5-325 MG PO TABS: 1.0000 | ORAL_TABLET | ORAL | Status: AC | PRN

## 2015-12-20 MED ORDER — NITROGLYCERIN 0.4 MG SL SUBL
0.4000 mg | SUBLINGUAL_TABLET | SUBLINGUAL | Status: DC | PRN
  Administered 2015-12-20: 0.4 mg via SUBLINGUAL

## 2015-12-20 MED ORDER — KETOROLAC TROMETHAMINE 30 MG/ML IJ SOLN
INTRAMUSCULAR | Status: AC
  Filled 2015-12-20: qty 1

## 2015-12-20 MED ORDER — SCOPOLAMINE 1 MG/3DAYS TD PT72
1.0000 | MEDICATED_PATCH | Freq: Once | TRANSDERMAL | Status: DC
  Administered 2015-12-20: 1.5 mg via TRANSDERMAL

## 2015-12-20 MED ORDER — CIPROFLOXACIN IN D5W 400 MG/200ML IV SOLN
400.0000 mg | INTRAVENOUS | Status: AC
  Administered 2015-12-20: 400 mg via INTRAVENOUS

## 2015-12-20 MED ORDER — ONDANSETRON HCL 4 MG/2ML IJ SOLN
INTRAMUSCULAR | Status: AC
  Filled 2015-12-20: qty 2

## 2015-12-20 MED ORDER — SODIUM CHLORIDE 0.9 % IR SOLN
Status: DC | PRN
Start: 2015-12-20 — End: 2015-12-20
  Administered 2015-12-20: 1000 mL

## 2015-12-20 MED ORDER — MIDAZOLAM HCL 2 MG/2ML IJ SOLN
1.0000 mg | INTRAMUSCULAR | Status: DC | PRN
  Administered 2015-12-20 (×2): 2 mg via INTRAVENOUS

## 2015-12-20 MED ORDER — POVIDONE-IODINE 10 % OINT PACKET
TOPICAL_OINTMENT | CUTANEOUS | Status: DC | PRN
  Administered 2015-12-20: 1 via TOPICAL

## 2015-12-20 MED ORDER — BUPIVACAINE HCL (PF) 0.5 % IJ SOLN
INTRAMUSCULAR | Status: AC
Start: 2015-12-20 — End: 2015-12-20
  Filled 2015-12-20: qty 30

## 2015-12-20 MED ORDER — NEOSTIGMINE METHYLSULFATE 10 MG/10ML IV SOLN
INTRAVENOUS | Status: DC | PRN
  Administered 2015-12-20: 3 mg via INTRAVENOUS

## 2015-12-20 MED ORDER — BUPIVACAINE HCL (PF) 0.5 % IJ SOLN
INTRAMUSCULAR | Status: DC | PRN
  Administered 2015-12-20: 10 mL

## 2015-12-20 MED ORDER — GLYCOPYRROLATE 0.2 MG/ML IJ SOLN
INTRAMUSCULAR | Status: DC | PRN
  Administered 2015-12-20: .6 mg via INTRAVENOUS

## 2015-12-20 MED ORDER — KETOROLAC TROMETHAMINE 30 MG/ML IJ SOLN
30.0000 mg | Freq: Once | INTRAMUSCULAR | Status: AC
Start: 2015-12-20 — End: 2015-12-20
  Administered 2015-12-20: 30 mg via INTRAVENOUS

## 2015-12-20 MED ORDER — FENTANYL CITRATE (PF) 250 MCG/5ML IJ SOLN
INTRAMUSCULAR | Status: AC
  Filled 2015-12-20: qty 5

## 2015-12-20 MED ORDER — PROPOFOL 10 MG/ML IV BOLUS
INTRAVENOUS | Status: DC | PRN
Start: 2015-12-20 — End: 2015-12-20
  Administered 2015-12-20: 160 mg via INTRAVENOUS

## 2015-12-20 MED ORDER — ROCURONIUM BROMIDE 100 MG/10ML IV SOLN
INTRAVENOUS | Status: DC | PRN
  Administered 2015-12-20: 25 mg via INTRAVENOUS
  Administered 2015-12-20: 5 mg via INTRAVENOUS

## 2015-12-20 MED ORDER — SCOPOLAMINE 1 MG/3DAYS TD PT72
MEDICATED_PATCH | TRANSDERMAL | Status: AC
  Filled 2015-12-20: qty 1

## 2015-12-20 MED ORDER — LACTATED RINGERS IV SOLN
INTRAVENOUS | Status: DC
  Administered 2015-12-20: 1000 mL via INTRAVENOUS
  Administered 2015-12-20: 12:00:00 via INTRAVENOUS

## 2015-12-20 MED ORDER — HEMOSTATIC AGENTS (NO CHARGE) OPTIME
TOPICAL | Status: DC | PRN
  Administered 2015-12-20: 1 via TOPICAL

## 2015-12-20 MED ORDER — ONDANSETRON HCL 4 MG PO TABS: 4.0000 mg | ORAL_TABLET | Freq: Three times a day (TID) | ORAL | Status: AC | PRN

## 2015-12-20 MED ORDER — METOPROLOL TARTRATE 1 MG/ML IV SOLN
2.5000 mg | INTRAVENOUS | Status: DC | PRN
  Administered 2015-12-20: 2.5 mg via INTRAVENOUS

## 2015-12-20 MED ORDER — ONDANSETRON HCL 4 MG/2ML IJ SOLN
INTRAMUSCULAR | Status: DC | PRN
  Administered 2015-12-20: 4 mg via INTRAVENOUS

## 2015-12-20 MED ORDER — NITROGLYCERIN 0.4 MG SL SUBL
0.4000 mg | SUBLINGUAL_TABLET | SUBLINGUAL | Status: DC | PRN
  Administered 2015-12-20 (×2): 0.4 mg via SUBLINGUAL

## 2015-12-20 MED ORDER — ROCURONIUM BROMIDE 50 MG/5ML IV SOLN
INTRAVENOUS | Status: AC
  Filled 2015-12-20: qty 1

## 2015-12-20 MED ORDER — FENTANYL CITRATE (PF) 100 MCG/2ML IJ SOLN
INTRAMUSCULAR | Status: AC
  Filled 2015-12-20: qty 2

## 2015-12-20 MED ORDER — PROPOFOL 10 MG/ML IV BOLUS
INTRAVENOUS | Status: AC
  Filled 2015-12-20: qty 20

## 2015-12-20 SURGICAL SUPPLY — 44 items
APPLIER CLIP LAPSCP 10X32 DD (CLIP) ×3 IMPLANT
BAG HAMPER (MISCELLANEOUS) ×3 IMPLANT
CHLORAPREP W/TINT 26ML (MISCELLANEOUS) ×3 IMPLANT
CLOTH BEACON ORANGE TIMEOUT ST (SAFETY) ×3 IMPLANT
COVER LIGHT HANDLE STERIS (MISCELLANEOUS) ×6 IMPLANT
DECANTER SPIKE VIAL GLASS SM (MISCELLANEOUS) ×3 IMPLANT
ELECT REM PT RETURN 9FT ADLT (ELECTROSURGICAL) ×3
ELECTRODE REM PT RTRN 9FT ADLT (ELECTROSURGICAL) ×1 IMPLANT
FILTER SMOKE EVAC LAPAROSHD (FILTER) ×3 IMPLANT
FORMALIN 10 PREFIL 120ML (MISCELLANEOUS) ×3 IMPLANT
GLOVE BIOGEL PI IND STRL 7.0 (GLOVE) ×3 IMPLANT
GLOVE BIOGEL PI INDICATOR 7.0 (GLOVE) ×6
GLOVE ECLIPSE 6.5 STRL STRAW (GLOVE) ×6 IMPLANT
GLOVE EXAM NITRILE MD LF STRL (GLOVE) ×3 IMPLANT
GLOVE SURG SS PI 7.5 STRL IVOR (GLOVE) ×3 IMPLANT
GOWN STRL REUS W/ TWL XL LVL3 (GOWN DISPOSABLE) ×1 IMPLANT
GOWN STRL REUS W/TWL LRG LVL3 (GOWN DISPOSABLE) ×6 IMPLANT
GOWN STRL REUS W/TWL XL LVL3 (GOWN DISPOSABLE) ×2
HEMOSTAT SNOW SURGICEL 2X4 (HEMOSTASIS) ×3 IMPLANT
INST SET LAPROSCOPIC AP (KITS) ×3 IMPLANT
IV NS 1000ML (IV SOLUTION) ×3
IV NS 1000ML BAXH (IV SOLUTION) ×1 IMPLANT
IV NS IRRIG 3000ML ARTHROMATIC (IV SOLUTION) IMPLANT
KIT ROOM TURNOVER APOR (KITS) ×3 IMPLANT
MANIFOLD NEPTUNE II (INSTRUMENTS) ×3 IMPLANT
NEEDLE INSUFFLATION 14GA 120MM (NEEDLE) ×3 IMPLANT
NS IRRIG 1000ML POUR BTL (IV SOLUTION) ×3 IMPLANT
PACK LAP CHOLE LZT030E (CUSTOM PROCEDURE TRAY) ×3 IMPLANT
PAD ARMBOARD 7.5X6 YLW CONV (MISCELLANEOUS) ×3 IMPLANT
POUCH SPECIMEN RETRIEVAL 10MM (ENDOMECHANICALS) ×3 IMPLANT
SET BASIN LINEN APH (SET/KITS/TRAYS/PACK) ×3 IMPLANT
SET TUBE IRRIG SUCTION NO TIP (IRRIGATION / IRRIGATOR) IMPLANT
SLEEVE ENDOPATH XCEL 5M (ENDOMECHANICALS) ×3 IMPLANT
SPONGE GAUZE 2X2 8PLY STER LF (GAUZE/BANDAGES/DRESSINGS) ×4
SPONGE GAUZE 2X2 8PLY STRL LF (GAUZE/BANDAGES/DRESSINGS) ×8 IMPLANT
STAPLER VISISTAT (STAPLE) ×3 IMPLANT
SUT VICRYL 0 UR6 27IN ABS (SUTURE) ×3 IMPLANT
TAPE CLOTH SURG 4X10 WHT LF (GAUZE/BANDAGES/DRESSINGS) ×3 IMPLANT
TROCAR ENDO BLADELESS 11MM (ENDOMECHANICALS) ×3 IMPLANT
TROCAR XCEL NON-BLD 5MMX100MML (ENDOMECHANICALS) ×3 IMPLANT
TROCAR XCEL UNIV SLVE 11M 100M (ENDOMECHANICALS) ×3 IMPLANT
TUBING INSUFFLATION (TUBING) ×3 IMPLANT
WARMER LAPAROSCOPE (MISCELLANEOUS) ×3 IMPLANT
YANKAUER SUCT 12FT TUBE ARGYLE (SUCTIONS) ×3 IMPLANT

## 2015-12-20 NOTE — Progress Notes (Signed)
Pt has c/o chest pain 9/10. States she feels "like an elephant is sitting on her chest". Dr. Benancio DeedsNewsome notified who in turn notified Dr. Lovell SheehanJenkins and obtained cardiologist consult. New orders received and followed. Labs pending. Dr. Darl HouseholderKoneswaren at the bedside.

## 2015-12-20 NOTE — Anesthesia Postprocedure Evaluation (Signed)
Anesthesia Post Note  Patient: Monica Lowery, Monica Lowery  Procedure(s) Performed: Procedure(s) (LRB): LAPAROSCOPIC CHOLECYSTECTOMY (N/A)  Patient location during evaluation: PACU Anesthesia Type: General Level of consciousness: awake and alert and oriented Pain management: pain level controlled Vital Signs Assessment: post-procedure vital signs reviewed and stable Respiratory status: spontaneous breathing Cardiovascular status: blood pressure returned to baseline Postop Assessment: no signs of nausea or vomiting Anesthetic complications: no    Last Vitals:  Filed Vitals:   12/20/15 1350 12/20/15 1355  BP: 117/79   Pulse: 66 67  Temp:    Resp: 17 15    Last Pain:  Filed Vitals:   12/20/15 1356  PainSc: 3    Patient c/o some chest pain postop.  Lab work obtained, cardiologist in to see patient.  No ECG changes.  VSS  Cardiologist plans to dc later todaqy if all lab work ok and no further chestpain.              Harlem Bula

## 2015-12-20 NOTE — Op Note (Signed)
Patient:  Monica Lowery  DOB:  Jul 31, 1971  MRN:  [id number]   Preop Diagnosis:  Chronic cholecystitis  Postop Diagnosis:  Same  Procedure:  Laparoscopic cholecystectomy  Surgeon:  Franky MachoMark Kaulana Brindle, M.D.  Anes:  Gen. endotracheal  Indications:  Patient is a [age] year old black female who presents with biliary colic secondary to chronic cholecystitis. The risks and benefits of the procedure including bleeding, infection, hepatobiliary injury, and the possibility of an open procedure were fully explained to the patient, who gave informed consent.  Procedure note:  The patient is placed the supine position. After induction of general endotracheal anesthesia, the abdomen was prepped and draped using the usual sterile technique with DuraPrep. Surgical site confirmation was performed.  A supraumbilical incision was made down to the fascia. A Veress needle was introduced into the abdominal cavity and confirmation of placement was done using the saline drop test. The abdomen was then insufflated to 16 mmHg pressure. An 11 mm trocar was introduced into the abdominal cavity under direct visualization without difficulty. The patient was placed in reverse Trendelenburg position and additional 11 mm trocar was placed the epigastric region 5 mm trochars were placed the right upper quadrant and right flank regions. Liver was inspected and noted to be within normal limits. The gallbladder was retracted in a dynamic fashion in order to provide a critical view of the triangle of Calot. The cystic duct was first identified. Its junction to the infundibulum was fully identified. Endoclips placed proximally and distally on the cystic duct, and the cystic duct was divided. This was likewise done to the cystic artery. The gallbladder was freed away from the gallbladder fossa using Bovie electrocautery. The gallbladder was delivered through the epigastric trocar site using an Endo Catch bag. The gallbladder fossa was  inspected and no abnormal bleeding or bile leakage was noted. Surgicel was placed in the gallbladder fossa. All fluid and air were then evacuated from the abdominal cavity prior to removal of the trochars.  All wounds were irrigated with normal saline. All wounds were injected with 0.5% Sensorcaine. The supraumbilical fascia was reapproximated using 0 Vicryl interrupted suture. All skin incisions were closed using staples. Betadine ointment and dry sterile dressings were applied.  All tape and needle counts were correct at the end of the procedure. Patient was extubated in the operating room and transferred to PACU in stable condition.  Complications:  None  EBL:  Minimal  Specimen:  Gallbladder

## 2015-12-20 NOTE — Interval H&P Note (Signed)
History and Physical Interval Note:  12/20/2015 9:50 AM  Monica Lowery  has presented today for surgery, with the diagnosis of chronic cholecystitis  The various methods of treatment have been discussed with the patient and family. After consideration of risks, benefits and other options for treatment, the patient has consented to  Procedure(s): LAPAROSCOPIC CHOLECYSTECTOMY (N/A) as a surgical intervention .  The patient's history has been reviewed, patient examined, no change in status, stable for surgery.  I have reviewed the patient's chart and labs.  Questions were answered to the patient's satisfaction.     Franky Macho A

## 2015-12-20 NOTE — Anesthesia Procedure Notes (Signed)
Procedure Name: Intubation Date/Time: 12/20/2015 10:46 AM Performed by: Glynn OctaveANIEL, Ellis Koffler E Pre-anesthesia Checklist: Patient identified, Patient being monitored, Timeout performed, Emergency Drugs available and Suction available Patient Re-evaluated:Patient Re-evaluated prior to inductionOxygen Delivery Method: Circle System Utilized Preoxygenation: Pre-oxygenation with 100% oxygen Intubation Type: IV induction, Rapid sequence and Cricoid Pressure applied Ventilation: Mask ventilation without difficulty Laryngoscope Size: Mac and 3 Grade View: Grade I Tube type: Oral Tube size: 7.0 mm Number of attempts: 1 Airway Equipment and Method: Stylet Placement Confirmation: ETT inserted through vocal cords under direct vision,  positive ETCO2 and breath sounds checked- equal and bilateral Secured at: 21 cm Tube secured with: Tape Dental Injury: Teeth and Oropharynx as per pre-operative assessment

## 2015-12-20 NOTE — Consult Note (Signed)
CARDIOLOGY CONSULT NOTE  Patient ID: Monica Lowery MRN: [id number] DOB/AGE: 45-Sep-1972 45 y.o.  Admit date: 12/20/2015 Primary Physician Fredirick MaudlinHAWKINS,EDWARD L, MD  Reason for Consultation: chest pain Consulting physician: Lovell SheehanJenkins  HPI: The patient is a 45 yr old [place] who underwent laparoscopic cholecystectomy for chronic cholecystitis earlier today, and developed retrosternal chest pain in recovery.  Was called by anesthesiology to discuss case. HR currently 63 bpm, BP 133/85 (175/104 earlier). No history of hypertension.  It was associated with nausea for which she received Zofran. Did not radiate into jaw or left arm. Received 2 SL nitroglycerin and IV metoprolol and pain is now rated at 3/10 and she is normotensive.  She has a h/o insulin-dependent diabetes mellitus.  Denies exertional chest pain nor worsening of baseline exertional dyspnea which she attributes to being overweight. Had some mild periankle edema the other day, but denies orthopnea and paroxysmal nocturnal dyspnea.  ECG shows sinus rhythm without acute ischemic ST-T abnormalities, nor any arrhythmias.  Most recent available stress test performed on 06/07/07 and was low risk without ischemia or infarction with normal LV ejection fraction.  Echocardiogram on 10/23/11 showed normal LV systolic function, EF 55-60%, normal regional wall motion, and mild tricuspid regurgitation.  Fam: Mother has "leaky valve". No h/o MI.   No Known Allergies  Current Facility-Administered Medications  Medication Dose Route Frequency Provider Last Rate Last Dose  . fentaNYL (SUBLIMAZE) injection 25-50 mcg  25-50 mcg Intravenous Q5 min PRN Benita StabileLisa Testa Newsome, MD   25 mcg at 12/20/15 1319  . lactated ringers infusion   Intravenous Continuous Benita StabileLisa Testa Newsome, MD 75 mL/hr at 12/20/15 1023    . metoprolol (LOPRESSOR) injection 2.5 mg  2.5 mg Intravenous Q5 min PRN Benita StabileLisa Testa Newsome, MD   2.5 mg at 12/20/15  1321  . midazolam (VERSED) injection 1-2 mg  1-2 mg Intravenous Q5 min PRN Benita StabileLisa Testa Newsome, MD   2 mg at 12/20/15 1030  . nitroGLYCERIN (NITROSTAT) SL tablet 0.4 mg  0.4 mg Sublingual Q5 min PRN Benita StabileLisa Testa Newsome, MD      . scopolamine (TRANSDERM-SCOP) 1 MG/3DAYS 1.5 mg  1 patch Transdermal Once Benita StabileLisa Testa Newsome, MD   1.5 mg at 12/20/15 1026    Past Medical History  Diagnosis Date  . Diabetes mellitus   . Thyroid disease   . Heart murmur     "slight"  . Leaky heart valve     per pt "very small"  . Dysrhythmia     occasional palpitations  . Hypothyroidism   . Liver nodule     focal nodule hyperplasia  . Headache(784.0)   . PONV (postoperative nausea and vomiting)   . Irritable bowel syndrome (IBS)   . Asthma     Have not used medication since 2002    Past Surgical History  Procedure Laterality Date  . Partial hysterectomy  2001    uterine prolapse, fibroids  . Shoulder surgery  2012    left  . Tubal ligation  2001  . Salpingoophorectomy Left 03/15/2013    Procedure: SALPINGO OOPHORECTOMY;  Surgeon: Serita KyleSheronette A Cousins, MD;  Location: [facility];  Service: Gynecology;  Laterality: Left;  . Robotic assisted laparoscopic lysis of adhesion N/A 03/15/2013    Procedure: ROBOTIC ASSISTED LAPAROSCOPIC LYSIS OF ADHESION;  Surgeon: Serita KyleSheronette A Cousins, MD;  Location: [facility];  Service: Gynecology;  Laterality: N/A;  . Unilateral salpingectomy Right 03/15/2013    Procedure: UNILATERAL SALPINGECTOMY;  Surgeon: Nena JordanSheronette  Cathie Beams, MD;  Location: [facility];  Service: Gynecology;  Laterality: Right;  . Ovarian cyst removal Right 03/15/2013    Procedure: OVARIAN CYSTECTOMY;  Surgeon: Serita Kyle, MD;  Location: [facility];  Service: Gynecology;  Laterality: Right;    Social History   Social History  . Marital Status: Married    Spouse Name: N/A  . Number of Children: N/A  . Years of Education: N/A   Occupational History  . Not on file.   Social History Main Topics  . Smoking  status: Never Smoker   . Smokeless tobacco: Not on file  . Alcohol Use: No  . Drug Use: No  . Sexual Activity: Not on file   Other Topics Concern  . Not on file   Social History Narrative       Prior to Admission medications   Medication Sig Start Date End Date Taking? Authorizing Provider  aspirin 81 MG tablet Take 81 mg by mouth daily as needed for pain.    Yes Historical Provider, MD  cetirizine (ZYRTEC) 10 MG tablet Take 10 mg by mouth daily.    Yes Historical Provider, MD  HYDROcodone-acetaminophen (NORCO/VICODIN) 5-325 MG tablet Take 1 tablet by mouth every 6 (six) hours as needed for moderate pain. 12/17/15  Yes Devoria Albe, MD  ibuprofen (ADVIL,MOTRIN) 200 MG tablet Take 400 mg by mouth every 6 (six) hours as needed for moderate pain.   Yes Historical Provider, MD  insulin aspart (NOVOLOG) 100 UNIT/ML injection Inject 1-10 Units into the skin 3 (three) times daily before meals. Based on carb ratio   Yes Historical Provider, MD  LANTUS 100 UNIT/ML injection Inject 26 Units into the skin at bedtime.  08/06/12  Yes Historical Provider, MD  levothyroxine (SYNTHROID, LEVOTHROID) 175 MCG tablet Take 175 mcg by mouth daily.  07/07/12  Yes Historical Provider, MD  Linaclotide Karlene Einstein) 145 MCG CAPS capsule Take 145 mcg by mouth daily.   Yes Historical Provider, MD  omeprazole (PRILOSEC) 20 MG capsule Take 1 po BID x 2 weeks then once a day 12/17/15  Yes Devoria Albe, MD  Phentermine-Topiramate (QSYMIA) 11.25-69 MG CP24 Take 1 capsule by mouth daily.   Yes Historical Provider, MD  ondansetron (ZOFRAN) 4 MG tablet Take 1 tablet (4 mg total) by mouth every 8 (eight) hours as needed for nausea or vomiting. 12/20/15   Franky Macho, MD  ONE TOUCH ULTRA TEST test strip  08/30/12   Historical Provider, MD  oxyCODONE-acetaminophen (PERCOCET) 7.5-325 MG tablet Take 1-2 tablets by mouth every 4 (four) hours as needed. 12/20/15   Franky Macho, MD     Review of systems complete and found to be negative unless  listed above in HPI     Physical exam Blood pressure 166/97, pulse 67, temperature 97.8 F (36.6 C), temperature source Oral, resp. rate 18, SpO2 97 %. General: NAD, drowsy Neck: No JVD, no thyromegaly or thyroid nodule.  Lungs: Clear to auscultation bilaterally with normal respiratory effort. CV: Nondisplaced PMI. Regular rate and rhythm, normal S1/S2, no S3/S4, soft 1/6 systolic murmur along left sternal border.  No peripheral edema.   Abdomen: Soft, no distention.  Skin: Intact without lesions or rashes.  Neurologic: Alert and oriented.  Psych: Normal affect. Extremities: No clubbing or cyanosis.  HEENT: Normal.   ECG: Most recent ECG reviewed.  Labs:   Lab Results  Component Value Date   WBC 5.9 12/17/2015   HGB 11.8* 12/17/2015   HCT 36.2 12/17/2015   MCV 87.9  12/17/2015   PLT 259 12/17/2015    Recent Labs Lab 12/17/15 0435  NA 139  K 3.5  CL 105  CO2 26  BUN 12  CREATININE 0.62  CALCIUM 8.7*  PROT 6.7  BILITOT 0.5  ALKPHOS 88  ALT 15  AST 19  GLUCOSE 115*   No results found for: CKTOTAL, CKMB, CKMBINDEX, TROPONINI No results found for: CHOL No results found for: HDL No results found for: LDLCALC No results found for: TRIG No results found for: CHOLHDL No results found for: LDLDIRECT       Studies: No results found.  ASSESSMENT AND PLAN:  1. Chest pain: Troponins pending. ECG is normal. No exertional CV symptoms prior to surgery. ECG without ischemic ST-T abnormalities. Primary risk factor is IDDM. Post-operative symptoms after laparoscopic cholecystectomy can certainly include chest pain. Would favor continued observation for another 2-3 hours to see if chest pain recurs. If troponin is normal and she remains free of chest pain, she can be discharged and I will arrange for outpatient follow up to see if noninvasive testing is warranted.   Signed: Prentice Docker, M.D., F.A.C.C.  12/20/2015, 1:31 PM

## 2015-12-20 NOTE — Progress Notes (Signed)
Patient resting comfortably. No complaints of pain.

## 2015-12-20 NOTE — Progress Notes (Signed)
Toponin result was negative. Contacted Dr. Purvis SheffieldKoneswaran and checked with him on what to do about patient as far as discharge. Dr. Purvis SheffieldKoneswaran stated patient could be discharged  And an appointment was made for a follow up visit at his office.

## 2015-12-20 NOTE — Discharge Instructions (Signed)
Laparoscopic Cholecystectomy, Care After °Refer to this sheet in the next few weeks. These instructions provide you with information about caring for yourself after your procedure. Your health care provider may also give you more specific instructions. Your treatment has been planned according to current medical practices, but problems sometimes occur. Call your health care provider if you have any problems or questions after your procedure. °WHAT TO EXPECT AFTER THE PROCEDURE °After your procedure, it is common to have: °· Pain at your incision sites. You will be given pain medicines to control your pain. °· Mild nausea or vomiting. This should improve after the first 24 hours. °· Bloating and possible shoulder pain from the gas that was used during the procedure. This will improve after the first 24 hours. °HOME CARE INSTRUCTIONS °Incision Care °· Follow instructions from your health care provider about how to take care of your incisions. Make sure you: °¨ Wash your hands with soap and water before you change your bandage (dressing). If soap and water are not available, use hand sanitizer. °¨ Change your dressing as told by your health care provider. °¨ Leave stitches (sutures), skin glue, or adhesive strips in place. These skin closures may need to be in place for 2 weeks or longer. If adhesive strip edges start to loosen and curl up, you may trim the loose edges. Do not remove adhesive strips completely unless your health care provider tells you to do that. °· Do not take baths, swim, or use a hot tub until your health care provider approves. Ask your health care provider if you can take showers. You may only be allowed to take sponge baths for bathing. °General Instructions °· Take over-the-counter and prescription medicines only as told by your health care provider. °· Do not drive or operate heavy machinery while taking prescription pain medicine. °· Return to your normal diet as told by your health care  provider. °· Do not lift anything that is heavier than 10 lb (4.5 kg). °· Do not play contact sports for one week or until your health care provider approves. °SEEK MEDICAL CARE IF:  °· You have redness, swelling, or pain at the site of your incision. °· You have fluid, blood, or pus coming from your incision. °· You notice a bad smell coming from your incision area. °· Your surgical incisions break open. °· You have a fever. °SEEK IMMEDIATE MEDICAL CARE IF: °· You develop a rash. °· You have difficulty breathing. °· You have chest pain. °· You have increasing pain in your shoulders (shoulder strap areas). °· You faint or have dizzy episodes while you are standing. °· You have severe pain in your abdomen. °· You have nausea or vomiting that lasts for more than one day. °  °This information is not intended to replace advice given to you by your health care provider. Make sure you discuss any questions you have with your health care provider. °  °Document Released: 11/23/2005 Document Revised: 08/14/2015 Document Reviewed: 07/05/2013 °Elsevier Interactive Patient Education ©2016 [employer] ° °

## 2015-12-20 NOTE — Anesthesia Preprocedure Evaluation (Signed)
Anesthesia Evaluation   Patient awake    Reviewed: Allergy & Precautions, NPO status , Patient's Chart, lab work & pertinent test results  History of Anesthesia Complications (+) PONV  Airway Mallampati: II  TM Distance: >3 FB     Dental  (+) Partial Lower, Partial Upper, Missing, Dental Advisory Given, Caps,    Pulmonary asthma ,    Pulmonary exam normal        Cardiovascular Normal cardiovascular exam+ dysrhythmias      Neuro/Psych  Headaches,  Neuromuscular disease    GI/Hepatic   Endo/Other  diabetes, Well Controlled, Type 1, Insulin DependentHypothyroidism   Renal/GU      Musculoskeletal  (+) Arthritis , Osteoarthritis,    Abdominal Normal abdominal exam  (+)   Peds  Hematology   Anesthesia Other Findings   Reproductive/Obstetrics                             Anesthesia Physical Anesthesia Plan  ASA: III  Anesthesia Plan: General   Post-op Pain Management:    Induction: Intravenous and Cricoid pressure planned  Airway Management Planned: Oral ETT  Additional Equipment:   Intra-op Plan:   Post-operative Plan: Extubation in OR  Informed Consent: I have reviewed the patients History and Physical, chart, labs and discussed the procedure including the risks, benefits and alternatives for the proposed anesthesia with the patient or authorized representative who has indicated his/her understanding and acceptance.   Dental advisory given  Plan Discussed with: CRNA  Anesthesia Plan Comments:         Anesthesia Quick Evaluation

## 2015-12-20 NOTE — Transfer of Care (Signed)
Immediate Anesthesia Transfer of Care Note  Patient: Monica Lowery  Procedure(s) Performed: Procedure(s): LAPAROSCOPIC CHOLECYSTECTOMY (N/A)  Patient Location: PACU  Anesthesia Type:General  Level of Consciousness: awake  Airway & Oxygen Therapy: Patient Spontanous Breathing and Patient connected to face mask oxygen  Post-op Assessment: Report given to RN and Post -op Vital signs reviewed and stable  Post vital signs: Reviewed and stable  Last Vitals:  Filed Vitals:   12/20/15 1030 12/20/15 1035  BP: 116/79 116/76  Pulse:    Temp:    Resp:      Complications: No apparent anesthesia complications

## 2015-12-21 LAB — CK TOTAL AND CKMB (NOT AT ARMC)
CK, MB: 2.3 ng/mL (ref 0.5–5.0)
Relative Index: 2.1 (ref 0.0–2.5)
Total CK: 112 U/L (ref 38–234)

## 2015-12-21 NOTE — ED Provider Notes (Signed)
Patient's care signed out to follow-up ultrasound and discharge with outpatient follow-up to either general surgery or primary doctor. Patient's had recurrent epigastric discomfort nausea vomiting and bloating.  On reexamination pain worsening nausea worsening. Ultrasound showed mild sludge no gallstones or cholecystitis. Blood work reassuring. Patient has mild tenderness epigastric and right upper quadrant. Discussed patient will need hida scan from primary doctor and MRI outpatient for further details of nodules. Discussed work note and reasons to return in follow-up. Discussed the case with primary Dr. who is ordering a hiatus scan to help with flow of her workup. Discussed outpatient surgery follow-up with patient.   Imaging Results    [place] Abdomen Limited Ruq  12/17/2015 CLINICAL DATA: Onset of right upper quadrant and epigastric pain 3 days ago ; history of diabetes and liver nodules EXAM: US ABDOMEN LIMITED - RIGHT UPPER QUADRANT COMPARISON: Abdominal and pelvic CT scan of July 17, 2014, and hepatic MRI of August 12, 2007. FINDINGS: Gallbladder: The gallbladder is adequately distended. A small amount of sludge or echogenic bile is present. There is no wall thickening, pericholecystic fluid, or positive sonographic Murphy's sign. Common bile duct: Diameter: 2.7 mm Liver: In the left hepatic lobe there is a solid-appearing heterogeneously hypoechoic mass measuring 6.0 x 4.4 x 6.6 cm. This was demonstrated on the previous CT scan no appeared slightly smaller. No definite nodules are noted elsewhere within the liver. There is no intrahepatic ductal dilation. IMPRESSION: 1. Gallbladder sludge without evidence of stones or sonographic evidence of acute cholecystitis. If gallbladder dysfunction is suspected clinically, a nuclear medicine hepatobiliary scan may be useful. 2. 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 Only the dominant left lobe lesion is  observed on today's ultrasound and it appears slightly larger. A follow-up hepatic protocol MRI would be useful to better characterize this lesion and assess the hepatic parenchymal for the previously demonstrated right lobe lesions. Electronically Signed By: David SwazilandJordan M.D. On: 12/17/2015 08:29     Upper abdominal pain  Abdominal bloating  Nausea and vomiting, vomiting of unspecified type  Gallbladder sludge  Liver nodule         Blane OharaJoshua Falan Hensler, MD 12/21/15 [phone]

## 2015-12-23 ENCOUNTER — Encounter (HOSPITAL_COMMUNITY): Payer: Self-pay | Admitting: General Surgery

## 2016-01-09 ENCOUNTER — Telehealth: Payer: Self-pay | Admitting: Cardiovascular Disease

## 2016-01-09 NOTE — Telephone Encounter (Signed)
Pt states she does not have the money for her copay and would like to know what will be done and discussed in the visit. Pt given the number for financial assistance [phone] Morrie Sheldon).

## 2016-01-09 NOTE — Telephone Encounter (Signed)
Pt would like someone to call her and let her know why she's needing this post hosp visit

## 2016-01-09 NOTE — Telephone Encounter (Signed)
Pt would like someone to give her a call and let her know why she's needing a post hosp f/u when she was only in the hosp due to having her gallbladder taken out

## 2016-01-10 ENCOUNTER — Encounter: Payer: BC Managed Care – PPO | Admitting: Cardiovascular Disease

## 2016-02-12 ENCOUNTER — Encounter: Payer: BC Managed Care – PPO | Admitting: Cardiovascular Disease

## 2016-03-17 ENCOUNTER — Other Ambulatory Visit (HOSPITAL_COMMUNITY): Payer: Self-pay | Admitting: Pulmonary Disease

## 2016-03-17 DIAGNOSIS — I739 Peripheral vascular disease, unspecified: Secondary | ICD-10-CM

## 2016-03-19 ENCOUNTER — Ambulatory Visit (HOSPITAL_COMMUNITY)
Admission: RE | Admit: 2016-03-19 | Discharge: 2016-03-19 | Disposition: A | Discharge status: Home / Self Care | Payer: BC Managed Care – PPO | Source: Ambulatory Visit | Attending: Pulmonary Disease | Admitting: Pulmonary Disease

## 2016-03-19 ENCOUNTER — Other Ambulatory Visit (HOSPITAL_COMMUNITY): Payer: Self-pay | Admitting: Pulmonary Disease

## 2016-03-19 DIAGNOSIS — I739 Peripheral vascular disease, unspecified: Secondary | ICD-10-CM | POA: Insufficient documentation

## 2016-04-07 ENCOUNTER — Other Ambulatory Visit (HOSPITAL_COMMUNITY): Payer: Self-pay | Admitting: Pulmonary Disease

## 2016-04-07 DIAGNOSIS — M79605 Pain in left leg: Secondary | ICD-10-CM

## 2016-04-17 ENCOUNTER — Ambulatory Visit (HOSPITAL_COMMUNITY): Admission: RE | Admit: 2016-04-17 | Payer: BC Managed Care – PPO | Source: Ambulatory Visit

## 2016-05-07 ENCOUNTER — Ambulatory Visit: Payer: Self-pay | Admitting: "Endocrinology

## 2016-06-11 ENCOUNTER — Ambulatory Visit: Payer: Self-pay | Admitting: "Endocrinology

## 2016-06-29 ENCOUNTER — Other Ambulatory Visit: Payer: Self-pay | Admitting: Obstetrics and Gynecology

## 2016-06-29 DIAGNOSIS — R928 Other abnormal and inconclusive findings on diagnostic imaging of breast: Secondary | ICD-10-CM

## 2016-07-01 ENCOUNTER — Other Ambulatory Visit: Payer: BC Managed Care – PPO

## 2016-08-27 ENCOUNTER — Ambulatory Visit (INDEPENDENT_AMBULATORY_CARE_PROVIDER_SITE_OTHER): Payer: BC Managed Care – PPO | Admitting: Otolaryngology

## 2017-01-28 ENCOUNTER — Emergency Department (HOSPITAL_COMMUNITY)
Admission: EM | Admit: 2017-01-28 | Discharge: 2017-01-29 | Disposition: A | Discharge status: Home / Self Care | Payer: BC Managed Care – PPO | Attending: Emergency Medicine | Admitting: Emergency Medicine

## 2017-01-28 ENCOUNTER — Encounter (HOSPITAL_COMMUNITY): Payer: Self-pay

## 2017-01-28 DIAGNOSIS — Y999 Unspecified external cause status: Secondary | ICD-10-CM | POA: Insufficient documentation

## 2017-01-28 DIAGNOSIS — J45909 Unspecified asthma, uncomplicated: Secondary | ICD-10-CM | POA: Diagnosis not present

## 2017-01-28 DIAGNOSIS — Y92009 Unspecified place in unspecified non-institutional (private) residence as the place of occurrence of the external cause: Secondary | ICD-10-CM | POA: Insufficient documentation

## 2017-01-28 DIAGNOSIS — E119 Type 2 diabetes mellitus without complications: Secondary | ICD-10-CM | POA: Insufficient documentation

## 2017-01-28 DIAGNOSIS — S93602A Unspecified sprain of left foot, initial encounter: Secondary | ICD-10-CM | POA: Diagnosis not present

## 2017-01-28 DIAGNOSIS — W108XXA Fall (on) (from) other stairs and steps, initial encounter: Secondary | ICD-10-CM | POA: Diagnosis not present

## 2017-01-28 DIAGNOSIS — Z7982 Long term (current) use of aspirin: Secondary | ICD-10-CM | POA: Diagnosis not present

## 2017-01-28 DIAGNOSIS — S93492A Sprain of other ligament of left ankle, initial encounter: Secondary | ICD-10-CM | POA: Diagnosis not present

## 2017-01-28 DIAGNOSIS — Z79899 Other long term (current) drug therapy: Secondary | ICD-10-CM | POA: Diagnosis not present

## 2017-01-28 DIAGNOSIS — Z794 Long term (current) use of insulin: Secondary | ICD-10-CM | POA: Diagnosis not present

## 2017-01-28 DIAGNOSIS — E039 Hypothyroidism, unspecified: Secondary | ICD-10-CM | POA: Insufficient documentation

## 2017-01-28 DIAGNOSIS — Y9301 Activity, walking, marching and hiking: Secondary | ICD-10-CM | POA: Diagnosis not present

## 2017-01-28 DIAGNOSIS — S99912A Unspecified injury of left ankle, initial encounter: Secondary | ICD-10-CM | POA: Diagnosis present

## 2017-01-28 DIAGNOSIS — S93422A Sprain of deltoid ligament of left ankle, initial encounter: Secondary | ICD-10-CM

## 2017-01-28 NOTE — ED Triage Notes (Signed)
Pt states she missed a step and fell down 3 steps twisting her left ankle.  Pt has mild swelling to same.

## 2017-01-29 ENCOUNTER — Emergency Department (HOSPITAL_COMMUNITY): Payer: BC Managed Care – PPO

## 2017-01-29 LAB — CBG MONITORING, ED: GLUCOSE-CAPILLARY: 228 mg/dL — AB (ref 65–99)

## 2017-01-29 MED ORDER — KETOROLAC TROMETHAMINE 30 MG/ML IJ SOLN
60.0000 mg | Freq: Once | INTRAMUSCULAR | Status: AC
  Administered 2017-01-29: 60 mg via INTRAMUSCULAR
  Filled 2017-01-29: qty 2

## 2017-01-29 MED ORDER — NAPROXEN 500 MG PO TABS: ORAL_TABLET | ORAL | 0 refills | Status: AC

## 2017-01-29 NOTE — ED Provider Notes (Signed)
[facility] Provider Note   CSN: [id number] Arrival date & time: 01/28/17  2321   By signing my name below, I, Bobbie Stackhristopher Reid, attest that this documentation has been prepared under the direction and in the presence of Devoria AlbeIva Jailin Manocchio, MD. Electronically Signed: Bobbie Stackhristopher Reid, Scribe. 01/29/17. 1:26 AM.  Time seen 12:07 AM   History   Chief Complaint Chief Complaint  Patient presents with  . Fall    ankle injury     HPI  HPI Comments: Monica Lowery is a 46 y.o. female who presents to the Emergency Department complaining of left ankle pain s/p fall that occurred around 7:45 pm on 01/28/2017. She states that she was walking down her steps at her house when she fell down the last 3 steps. She believes that she rolled her left ankle which made her fall.  She was able to walk on her foot after the incident with pain and drove herself to the ED. Marland Kitchen.  She denies loc, hitting her head  or any other injuries.     PCP Fredirick MaudlinHAWKINS,EDWARD L, MD  Past Medical History:  Diagnosis Date  . Asthma    Have not used medication since 2002  . Diabetes mellitus   . Dysrhythmia    occasional palpitations  . Headache(784.0)   . Heart murmur    "slight"  . Hypothyroidism   . Irritable bowel syndrome (IBS)   . Leaky heart valve    per pt "very small"  . Liver nodule    focal nodule hyperplasia  . PONV (postoperative nausea and vomiting)   . Thyroid disease     Patient Active Problem List   Diagnosis Date Noted  . Constipation, chronic 10/03/2012  . Obesity (BMI 30-39.9) 10/03/2012  . Abdominal bloating 10/03/2012  . Diastasis recti, supra/periumbilical 10/03/2012  . Stress incontinence, female   . Frozen shoulder 10/21/2011  . Pain in joint, shoulder region 10/21/2011  . Muscle weakness (generalized) 10/21/2011    Past Surgical History:  Procedure Laterality Date  . CHOLECYSTECTOMY N/A 12/20/2015   Procedure: LAPAROSCOPIC CHOLECYSTECTOMY;  Surgeon: Franky MachoMark Jenkins, MD;   Location: [facility];  Service: General;  Laterality: N/A;  . OVARIAN CYST REMOVAL Right 03/15/2013   Procedure: OVARIAN CYSTECTOMY;  Surgeon: Serita KyleSheronette A Cousins, MD;  Location: [facility];  Service: Gynecology;  Laterality: Right;  . PARTIAL HYSTERECTOMY  2001   uterine prolapse, fibroids  . ROBOTIC ASSISTED LAPAROSCOPIC LYSIS OF ADHESION N/A 03/15/2013   Procedure: ROBOTIC ASSISTED LAPAROSCOPIC LYSIS OF ADHESION;  Surgeon: Serita KyleSheronette A Cousins, MD;  Location: [facility];  Service: Gynecology;  Laterality: N/A;  . SALPINGOOPHORECTOMY Left 03/15/2013   Procedure: SALPINGO OOPHORECTOMY;  Surgeon: Serita KyleSheronette A Cousins, MD;  Location: [facility];  Service: Gynecology;  Laterality: Left;  . SHOULDER SURGERY  2012   left  . TUBAL LIGATION  2001  . UNILATERAL SALPINGECTOMY Right 03/15/2013   Procedure: UNILATERAL SALPINGECTOMY;  Surgeon: Serita KyleSheronette A Cousins, MD;  Location: [facility];  Service: Gynecology;  Laterality: Right;    OB History    No data available       Home Medications    Prior to Admission medications   Medication Sig Start Date End Date Taking? Authorizing Provider  aspirin 81 MG tablet Take 81 mg by mouth daily as needed for pain.     Historical Provider, MD  cetirizine (ZYRTEC) 10 MG tablet Take 10 mg by mouth daily.     Historical Provider, MD  ibuprofen (ADVIL,MOTRIN) 200 MG tablet Take 400 mg by  mouth every 6 (six) hours as needed for moderate pain.    Historical Provider, MD  insulin aspart (NOVOLOG) 100 UNIT/ML injection Inject 1-10 Units into the skin 3 (three) times daily before meals. Based on carb ratio    Historical Provider, MD  LANTUS 100 UNIT/ML injection Inject 26 Units into the skin at bedtime.  08/06/12   Historical Provider, MD  levothyroxine (SYNTHROID, LEVOTHROID) 175 MCG tablet Take 175 mcg by mouth daily.  07/07/12   Historical Provider, MD  Linaclotide Karlene Einstein) 145 MCG CAPS capsule Take 145 mcg by mouth daily.    Historical Provider, MD  naproxen (NAPROSYN) 500 MG tablet Take  1 po BID with food prn pain 01/29/17   Devoria Albe, MD  omeprazole (PRILOSEC) 20 MG capsule Take 1 po BID x 2 weeks then once a day 12/17/15   Devoria Albe, MD  ondansetron (ZOFRAN) 4 MG tablet Take 1 tablet (4 mg total) by mouth every 8 (eight) hours as needed for nausea or vomiting. 12/20/15   Franky Macho, MD  ONE TOUCH ULTRA TEST test strip  08/30/12   Historical Provider, MD  oxyCODONE-acetaminophen (PERCOCET) 7.5-325 MG tablet Take 1-2 tablets by mouth every 4 (four) hours as needed. 12/20/15   Franky Macho, MD  Phentermine-Topiramate (QSYMIA) 11.25-69 MG CP24 Take 1 capsule by mouth daily.    Historical Provider, MD    Family History No family history on file.  Social History Social History  Substance Use Topics  . Smoking status: Never Smoker  . Smokeless tobacco: Never Used  . Alcohol use No  employed   Allergies   Patient has no known allergies.   Review of Systems Review of Systems  All other systems reviewed and are negative.    Physical Exam Updated Vital Signs BP 122/83 (BP Location: Right Arm)   Pulse 92   Temp 98.1 F (36.7 C) (Oral)   Resp 18   Ht 5\' 5"  (1.651 m)   Wt 180 lb (81.6 kg)   SpO2 100%   BMI 29.95 kg/m   Vital signs normal    Physical Exam  Constitutional: She is oriented to person, place, and time. She appears well-developed and well-nourished.  Non-toxic appearance. She does not appear ill. No distress.  HENT:  Head: Normocephalic and atraumatic.  Right Ear: External ear normal.  Left Ear: External ear normal.  Nose: Nose normal.  Mouth/Throat: Mucous membranes are normal.  Eyes: Conjunctivae and EOM are normal.  Neck: Normal range of motion and full passive range of motion without pain.  Cardiovascular: Normal rate.   Pulmonary/Chest: Effort normal. No respiratory distress. She has no rhonchi. She exhibits no crepitus.  Abdominal: Normal appearance.  Musculoskeletal: Normal range of motion. She exhibits edema and tenderness.  Moves all  extremities well. Tender medial and lateral malleoli of the left ankle with minimal swelling. Tener over the lateral Proximal foot. Seems more tender over the lateral proximal foot.  Neurological: She is alert and oriented to person, place, and time. She has normal strength. No cranial nerve deficit.  Skin: Skin is warm, dry and intact. No rash noted. No erythema. No pallor.  Psychiatric: She has a normal mood and affect. Her speech is normal and behavior is normal. Her mood appears not anxious.  Nursing note and vitals reviewed.    ED Treatments / Results   Radiology Dg Ankle Complete Left  Result Date: 01/29/2017 CLINICAL DATA:  Tripped while going down stairs, with twisting injury to the left ankle. Initial encounter.  EXAM: LEFT ANKLE COMPLETE - 3+ VIEW COMPARISON:  Left ankle radiographs performed 10/09/2011 FINDINGS: There is no evidence of fracture or dislocation. The ankle mortise is intact; the interosseous space is within normal limits. No talar tilt or subluxation is seen. The joint spaces are preserved. Mild anterior soft tissue swelling is noted at the ankle. IMPRESSION: No evidence of fracture or dislocation. Electronically Signed   By: Roanna Raider M.D.   On: 01/29/2017 01:03   Dg Foot Complete Left  Result Date: 01/29/2017 CLINICAL DATA:  Tripped while going down stairs, with twisting ankle injury. Left lateral ankle pain, extending to the foot. Initial encounter. EXAM: LEFT FOOT - COMPLETE 3+ VIEW COMPARISON:  Left ankle radiographs performed 12/18/2005 FINDINGS: There is no evidence of fracture or dislocation. The joint spaces are preserved. There is no evidence of talar subluxation; the subtalar joint is unremarkable in appearance. Mild soft tissue swelling is noted at the anterior aspect of the ankle. IMPRESSION: No evidence of fracture or dislocation. Electronically Signed   By: Roanna Raider M.D.   On: 01/29/2017 01:04    Procedures Procedures (including critical care  time)  Medications Ordered in ED Medications  ketorolac (TORADOL) 30 MG/ML injection 60 mg (60 mg Intramuscular Given 01/29/17 0029)     Initial Impression / Assessment and Plan / ED Course  I have reviewed the triage vital signs and the nursing notes.  Pertinent labs & imaging results that were available during my care of the patient were reviewed by me and considered in my medical decision making (see chart for details).  Pt was given IM toradol for pain.  After reviewing her xrays she was placed in an ASO and crutches. She was advised to elevate her foot, use ice, placed on naproxen for pain. She can be reevaluated by orthopedics if not improving in the next week.   Final Clinical Impressions(s) / ED Diagnoses   Final diagnoses:  Sprain of left foot, initial encounter  Sprain of left medial ankle joint, initial encounter    New Prescriptions New Prescriptions   NAPROXEN (NAPROSYN) 500 MG TABLET    Take 1 po BID with food prn pain    Plan discharge  Devoria Albe, MD, FACEP  I personally performed the services described in this documentation, which was scribed in my presence. The recorded information has been reviewed and considered.  Devoria Albe, MD, Concha Pyo, MD 01/29/17 [phone]

## 2017-01-29 NOTE — ED Notes (Signed)
Patient transported to X-ray 

## 2017-01-29 NOTE — Discharge Instructions (Signed)
Elevate your foot. Use ice packs for comfort and to reduce swelling. Take the naproxen for pain.  Wear the ankle support to stabilize your ankle. Use the crutches until you are able to walk without them. If you are still painful next week, you can call Dr Mort SawyersHarrison's office to get an appointment to have him recheck your ankle.

## 2017-02-01 ENCOUNTER — Telehealth: Payer: Self-pay | Admitting: Orthopedic Surgery

## 2017-02-01 ENCOUNTER — Encounter: Payer: Self-pay | Admitting: Orthopedic Surgery

## 2017-02-01 NOTE — Telephone Encounter (Signed)
Patient called and left a message on the answering machine that she went to the ED for a sprain ankle.  She wanted to set up an appointment here in our office.  I called her back but got a voicemail stating that the mailbox was full and I couldn't even left a message.  I called this number twice, once at 8:45 am and again at 10:50am.  I will send a letter to this patient asking her to call the office again since I am unable to get in touch with her by phone.

## 2017-07-28 ENCOUNTER — Other Ambulatory Visit (HOSPITAL_COMMUNITY): Payer: Self-pay | Admitting: Pulmonary Disease

## 2017-07-28 DIAGNOSIS — R1032 Left lower quadrant pain: Secondary | ICD-10-CM

## 2017-08-04 ENCOUNTER — Ambulatory Visit (HOSPITAL_COMMUNITY): Payer: BC Managed Care – PPO

## 2017-08-13 ENCOUNTER — Ambulatory Visit (HOSPITAL_COMMUNITY)
Admission: RE | Admit: 2017-08-13 | Discharge: 2017-08-13 | Disposition: A | Discharge status: Home / Self Care | Payer: BC Managed Care – PPO | Source: Ambulatory Visit | Attending: Pulmonary Disease | Admitting: Pulmonary Disease

## 2017-08-13 DIAGNOSIS — R1032 Left lower quadrant pain: Secondary | ICD-10-CM

## 2017-08-13 DIAGNOSIS — Z90711 Acquired absence of uterus with remaining cervical stump: Secondary | ICD-10-CM | POA: Diagnosis not present

## 2017-08-13 DIAGNOSIS — K439 Ventral hernia without obstruction or gangrene: Secondary | ICD-10-CM | POA: Insufficient documentation

## 2017-08-13 DIAGNOSIS — K769 Liver disease, unspecified: Secondary | ICD-10-CM | POA: Diagnosis not present

## 2017-08-13 DIAGNOSIS — R195 Other fecal abnormalities: Secondary | ICD-10-CM | POA: Insufficient documentation

## 2017-08-13 DIAGNOSIS — Z9049 Acquired absence of other specified parts of digestive tract: Secondary | ICD-10-CM | POA: Diagnosis not present

## 2017-08-13 DIAGNOSIS — R935 Abnormal findings on diagnostic imaging of other abdominal regions, including retroperitoneum: Secondary | ICD-10-CM | POA: Diagnosis not present

## 2017-08-13 LAB — POCT I-STAT CREATININE: Creatinine, Ser: 0.6 mg/dL (ref 0.44–1.00)

## 2017-08-13 MED ORDER — IOPAMIDOL (ISOVUE-300) INJECTION 61%
100.0000 mL | Freq: Once | INTRAVENOUS | Status: AC | PRN
  Administered 2017-08-13: 100 mL via INTRAVENOUS

## 2017-08-23 ENCOUNTER — Encounter: Payer: Self-pay | Admitting: Gastroenterology

## 2017-08-26 ENCOUNTER — Telehealth: Payer: Self-pay | Admitting: General Practice

## 2017-08-26 NOTE — Telephone Encounter (Signed)
Patient called but before I find out who had called her she said that she needed to go and would call me back in a few minutes.

## 2017-08-26 NOTE — Telephone Encounter (Signed)
I tried to call the patient and see if she was able to come in to see SLF on October 3rd or 4th at 11:30 am.  I was unable to leave a message.

## 2017-08-26 NOTE — Telephone Encounter (Signed)
LMOM that SF could see her on 10/3 or 10/4 at 1130 and to call us back to let us know which day she would like.

## 2017-08-26 NOTE — Telephone Encounter (Signed)
Routing to Susan. 

## 2017-09-28 ENCOUNTER — Other Ambulatory Visit: Payer: Self-pay | Admitting: Obstetrics and Gynecology

## 2017-09-29 ENCOUNTER — Encounter: Payer: Self-pay | Admitting: Gastroenterology

## 2017-09-29 ENCOUNTER — Telehealth: Payer: Self-pay | Admitting: Gastroenterology

## 2017-09-29 ENCOUNTER — Ambulatory Visit: Payer: BC Managed Care – PPO | Admitting: Gastroenterology

## 2017-09-29 NOTE — Telephone Encounter (Signed)
NOTIFY PCP. 

## 2017-09-29 NOTE — Telephone Encounter (Signed)
PATIENT WAS A NO SHOW AND LETTER SENT  °

## 2018-01-02 ENCOUNTER — Encounter (HOSPITAL_COMMUNITY): Payer: Self-pay

## 2018-01-02 ENCOUNTER — Emergency Department (HOSPITAL_COMMUNITY)
Admission: EM | Admit: 2018-01-02 | Discharge: 2018-01-02 | Disposition: A | Discharge status: Home / Self Care | Payer: BC Managed Care – PPO | Attending: Emergency Medicine | Admitting: Emergency Medicine

## 2018-01-02 ENCOUNTER — Other Ambulatory Visit: Payer: Self-pay

## 2018-01-02 DIAGNOSIS — T1592XA Foreign body on external eye, part unspecified, left eye, initial encounter: Secondary | ICD-10-CM

## 2018-01-02 DIAGNOSIS — Z23 Encounter for immunization: Secondary | ICD-10-CM | POA: Diagnosis not present

## 2018-01-02 DIAGNOSIS — Z7982 Long term (current) use of aspirin: Secondary | ICD-10-CM | POA: Insufficient documentation

## 2018-01-02 DIAGNOSIS — Y999 Unspecified external cause status: Secondary | ICD-10-CM | POA: Diagnosis not present

## 2018-01-02 DIAGNOSIS — E119 Type 2 diabetes mellitus without complications: Secondary | ICD-10-CM | POA: Insufficient documentation

## 2018-01-02 DIAGNOSIS — E039 Hypothyroidism, unspecified: Secondary | ICD-10-CM | POA: Insufficient documentation

## 2018-01-02 DIAGNOSIS — Y92410 Unspecified street and highway as the place of occurrence of the external cause: Secondary | ICD-10-CM | POA: Diagnosis not present

## 2018-01-02 DIAGNOSIS — T1512XA Foreign body in conjunctival sac, left eye, initial encounter: Secondary | ICD-10-CM | POA: Diagnosis not present

## 2018-01-02 DIAGNOSIS — M62830 Muscle spasm of back: Secondary | ICD-10-CM | POA: Diagnosis not present

## 2018-01-02 DIAGNOSIS — Y9389 Activity, other specified: Secondary | ICD-10-CM | POA: Insufficient documentation

## 2018-01-02 DIAGNOSIS — Z794 Long term (current) use of insulin: Secondary | ICD-10-CM | POA: Diagnosis not present

## 2018-01-02 DIAGNOSIS — Z79899 Other long term (current) drug therapy: Secondary | ICD-10-CM | POA: Diagnosis not present

## 2018-01-02 DIAGNOSIS — S3992XA Unspecified injury of lower back, initial encounter: Secondary | ICD-10-CM | POA: Diagnosis present

## 2018-01-02 DIAGNOSIS — J45909 Unspecified asthma, uncomplicated: Secondary | ICD-10-CM | POA: Insufficient documentation

## 2018-01-02 MED ORDER — DICLOFENAC SODIUM 50 MG PO TBEC: 50.0000 mg | DELAYED_RELEASE_TABLET | Freq: Two times a day (BID) | ORAL | 0 refills | Status: AC

## 2018-01-02 MED ORDER — ERYTHROMYCIN 5 MG/GM OP OINT
TOPICAL_OINTMENT | Freq: Once | OPHTHALMIC | Status: AC
  Administered 2018-01-02: 23:00:00 via OPHTHALMIC
  Filled 2018-01-02: qty 3.5

## 2018-01-02 MED ORDER — CYCLOBENZAPRINE HCL 10 MG PO TABS: 10.0000 mg | ORAL_TABLET | Freq: Two times a day (BID) | ORAL | 0 refills | Status: AC | PRN

## 2018-01-02 MED ORDER — FLUORESCEIN SODIUM 1 MG OP STRP
1.0000 | ORAL_STRIP | Freq: Once | OPHTHALMIC | Status: AC
  Administered 2018-01-02: 1 via OPHTHALMIC
  Filled 2018-01-02: qty 1

## 2018-01-02 MED ORDER — CYCLOBENZAPRINE HCL 10 MG PO TABS
10.0000 mg | ORAL_TABLET | Freq: Once | ORAL | Status: AC
  Administered 2018-01-02: 10 mg via ORAL
  Filled 2018-01-02: qty 1

## 2018-01-02 MED ORDER — TETRACAINE HCL 0.5 % OP SOLN
1.0000 [drp] | Freq: Once | OPHTHALMIC | Status: AC
  Administered 2018-01-02: 1 [drp] via OPHTHALMIC
  Filled 2018-01-02: qty 4

## 2018-01-02 MED ORDER — TETANUS-DIPHTH-ACELL PERTUSSIS 5-2.5-18.5 LF-MCG/0.5 IM SUSP
0.5000 mL | Freq: Once | INTRAMUSCULAR | Status: AC
  Administered 2018-01-02: 0.5 mL via INTRAMUSCULAR
  Filled 2018-01-02: qty 0.5
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
## 2018-01-02 NOTE — Discharge Instructions (Signed)
Use the eye ointment 3 times a day for the next week. Follow up with the eye doctor.  Do not drive while taking the muscle relaxer as it will make you sleepy. Follow up with your primary care doctor. Return here as needed.
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
## 2018-01-02 NOTE — ED Notes (Signed)
OS 20/30 OD 20/25 OU 20/25
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
## 2018-05-27 ENCOUNTER — Ambulatory Visit (HOSPITAL_COMMUNITY)
Admission: RE | Admit: 2018-05-27 | Discharge: 2018-05-27 | Disposition: A | Discharge status: Home / Self Care | Payer: BC Managed Care – PPO | Source: Ambulatory Visit | Attending: Pulmonary Disease | Admitting: Pulmonary Disease

## 2018-05-27 ENCOUNTER — Other Ambulatory Visit (HOSPITAL_COMMUNITY): Payer: Self-pay | Admitting: Pulmonary Disease

## 2018-05-27 DIAGNOSIS — R05 Cough: Secondary | ICD-10-CM

## 2018-05-27 DIAGNOSIS — J9811 Atelectasis: Secondary | ICD-10-CM | POA: Insufficient documentation

## 2018-05-27 DIAGNOSIS — R059 Cough, unspecified: Secondary | ICD-10-CM

## 2019-01-23 ENCOUNTER — Ambulatory Visit (HOSPITAL_COMMUNITY)
Admission: RE | Admit: 2019-01-23 | Discharge: 2019-01-23 | Disposition: A | Discharge status: Home / Self Care | Payer: BC Managed Care – PPO | Source: Ambulatory Visit | Attending: Pulmonary Disease | Admitting: Pulmonary Disease

## 2019-01-23 ENCOUNTER — Other Ambulatory Visit: Payer: Self-pay | Admitting: Pulmonary Disease

## 2019-01-23 DIAGNOSIS — M79604 Pain in right leg: Secondary | ICD-10-CM

## 2019-05-20 IMAGING — DX DG CHEST 2V
2 series · 2 of 2 positions shown · non-contrast
Comparison: None.

CLINICAL DATA: Shortness of breath and cough

EXAM:
CHEST - 2 VIEW

[chest pa]
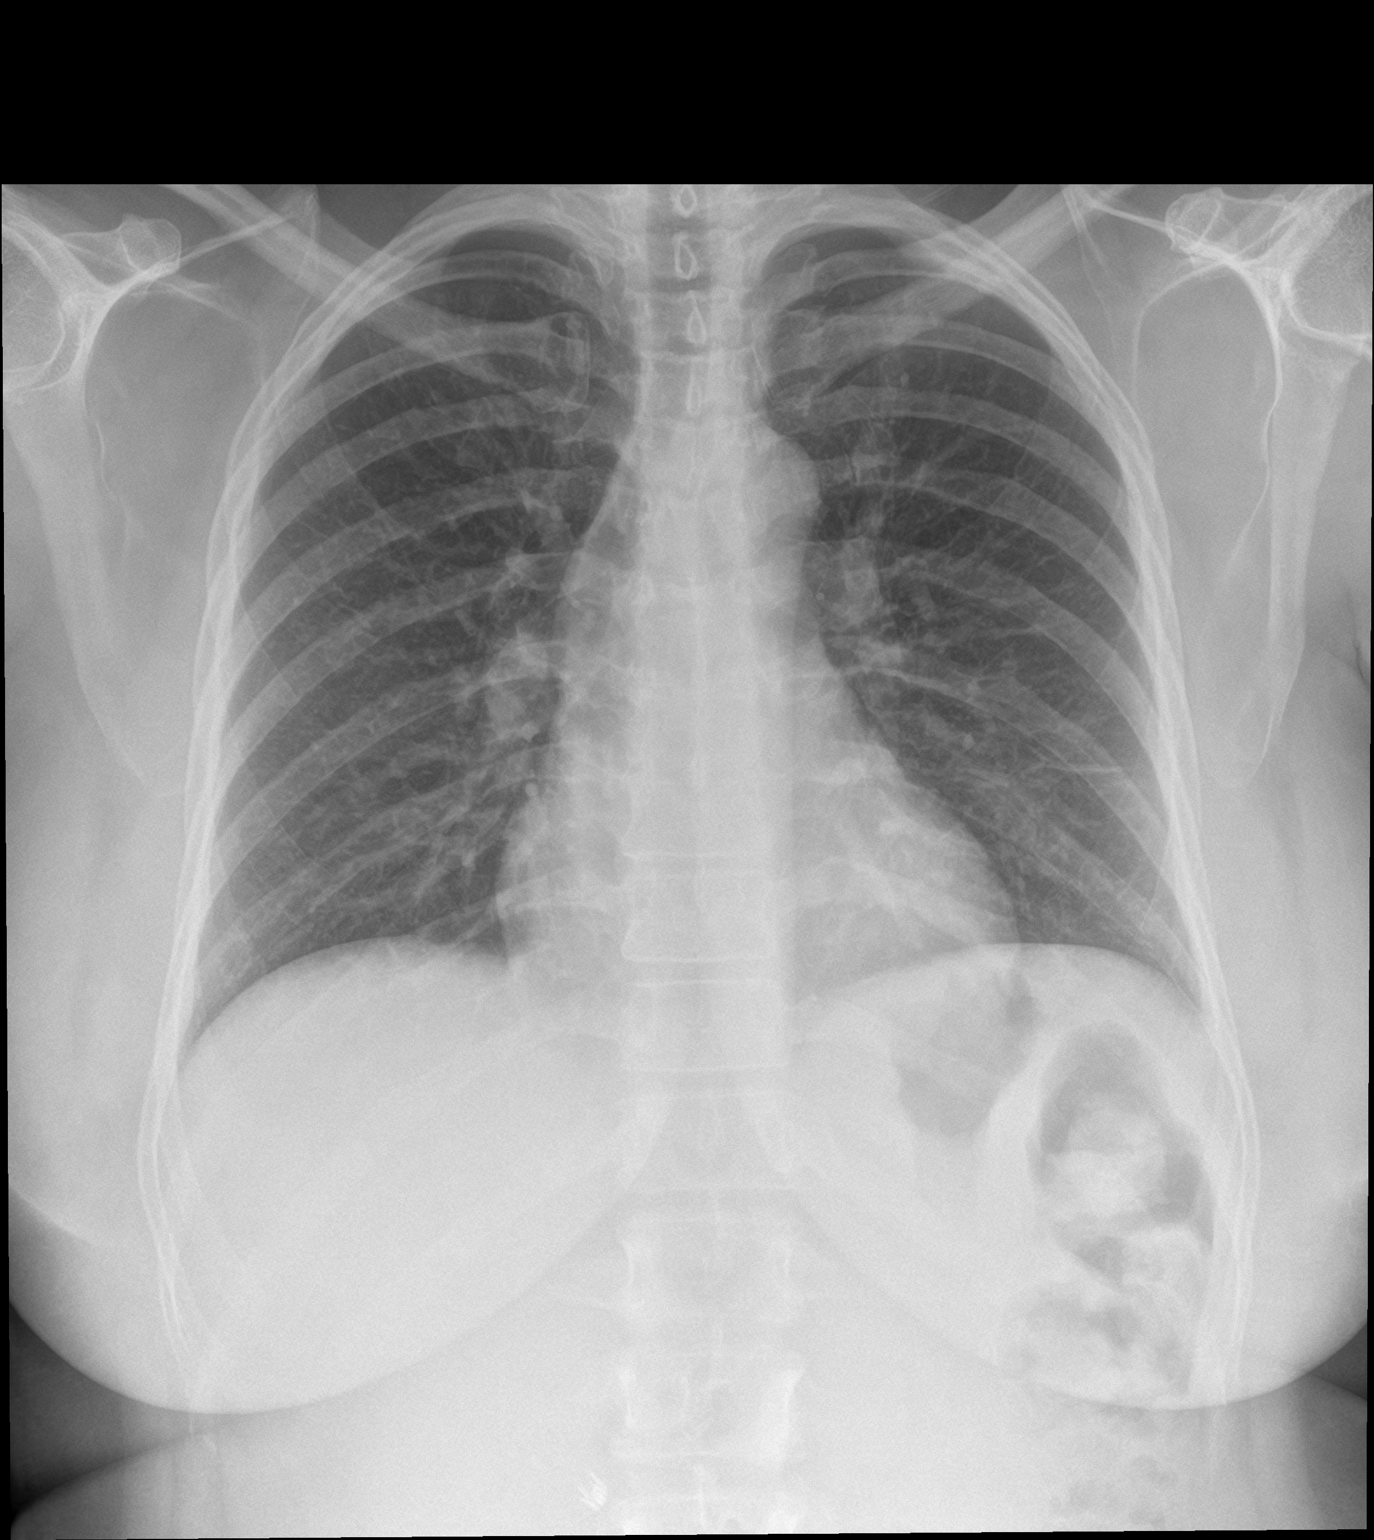

[chest lat]
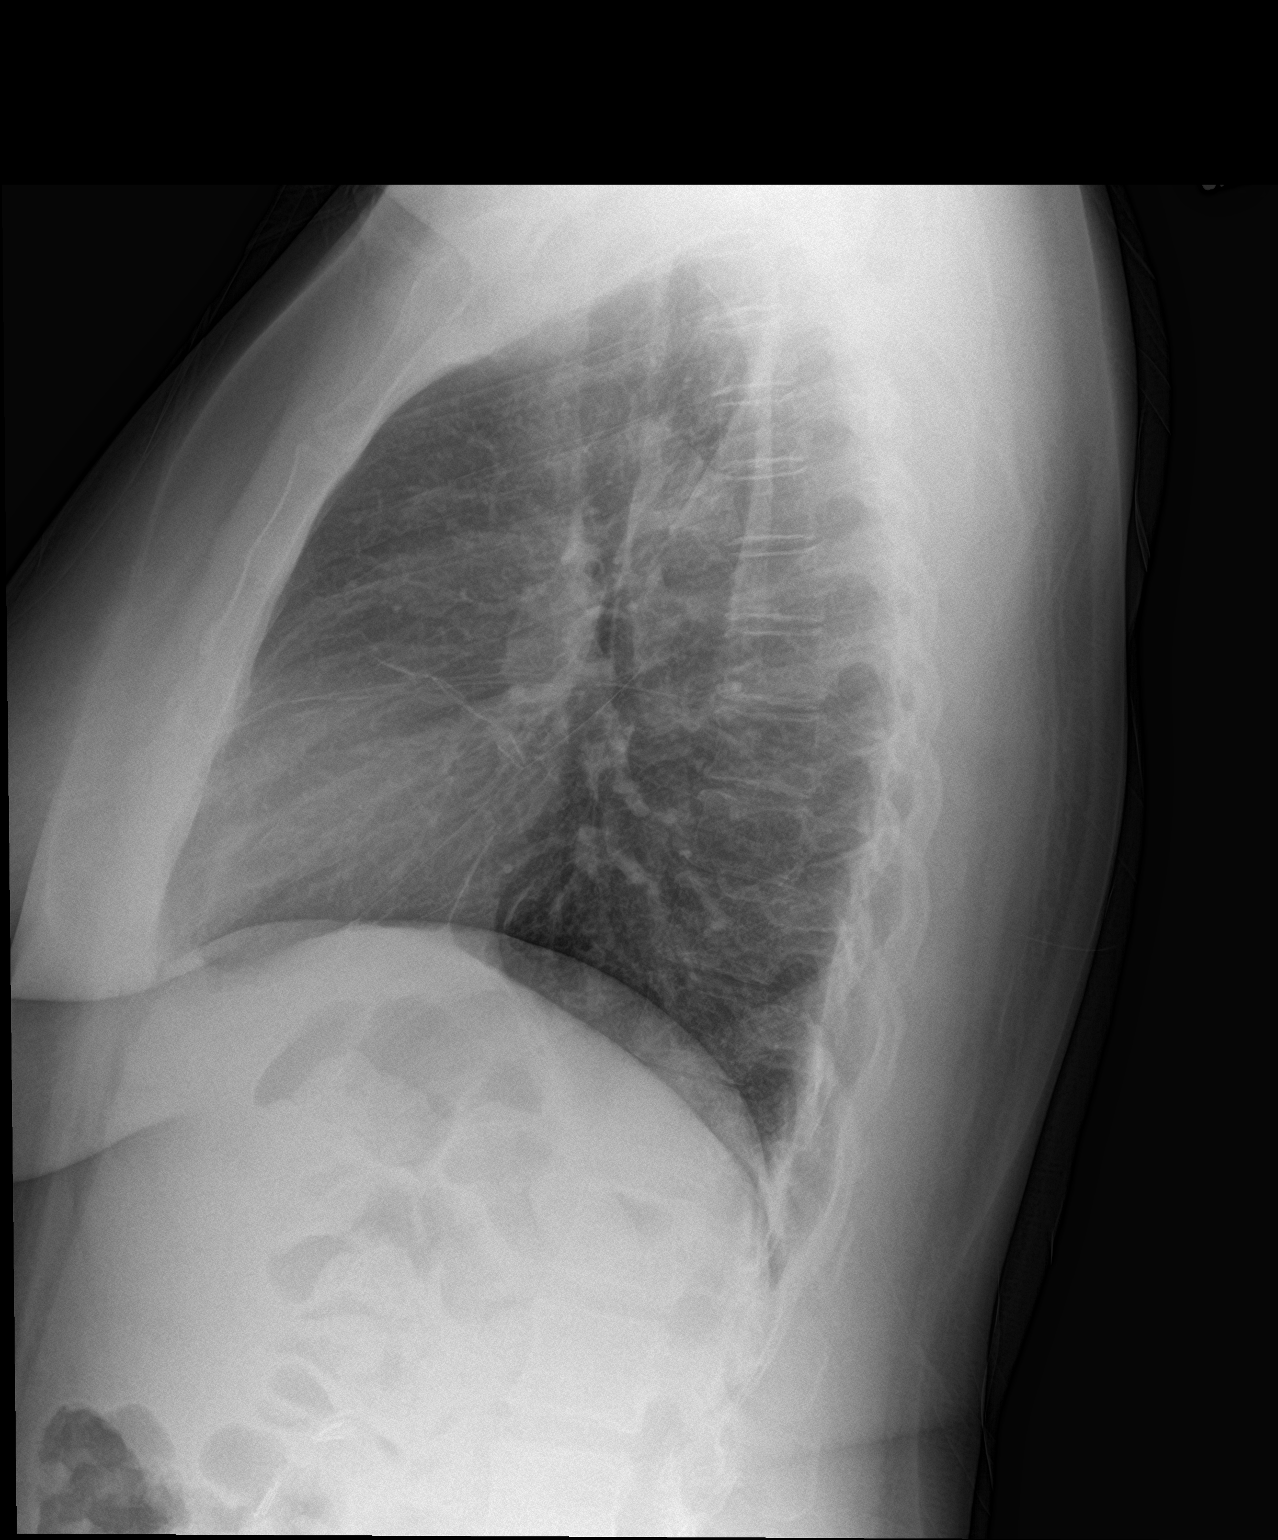

[2 of 2 positions shown; findings below may reference images not displayed]

FINDINGS: There is slight atelectatic change in the inferior lingula. Lungs
are otherwise clear. The heart size and pulmonary vascularity are
normal. No adenopathy. No evident bone lesions.
IMPRESSION: Mild inferior lingular atelectasis. Lungs elsewhere clear. Heart
size normal.

## 2020-01-16 IMAGING — US RIGHT LOWER EXTREMITY VENOUS ULTRASOUND
1 series · 13 of 24 positions shown · non-contrast
Comparison: None.

CLINICAL DATA: Right lower extremity pain and edema.



[Series 1: right lower extremity venous ultrasound · 0.08mm/px · 13 of 55 slices shown]
[im 1/55]
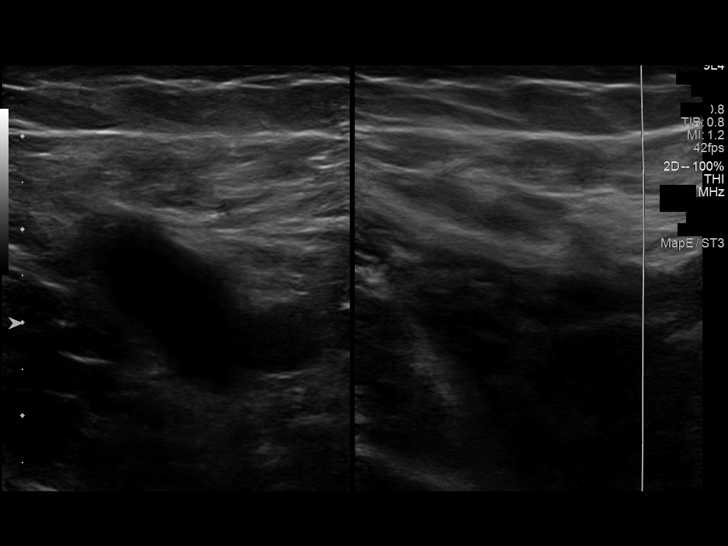
[im 5/55]
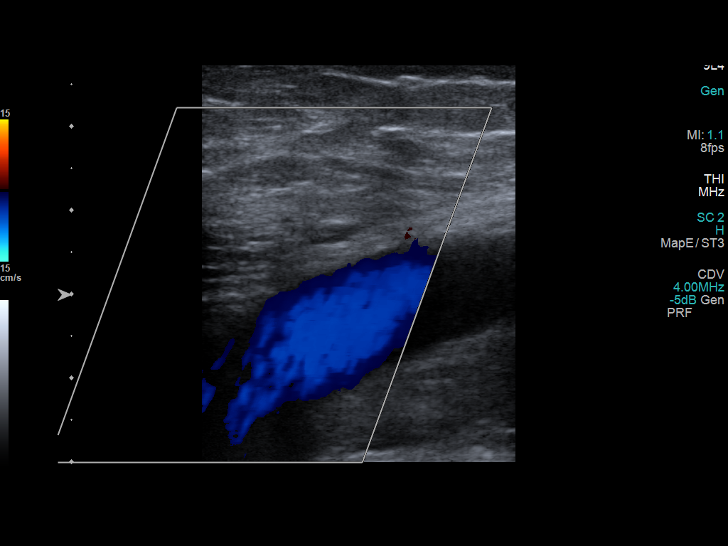
[im 10/55]
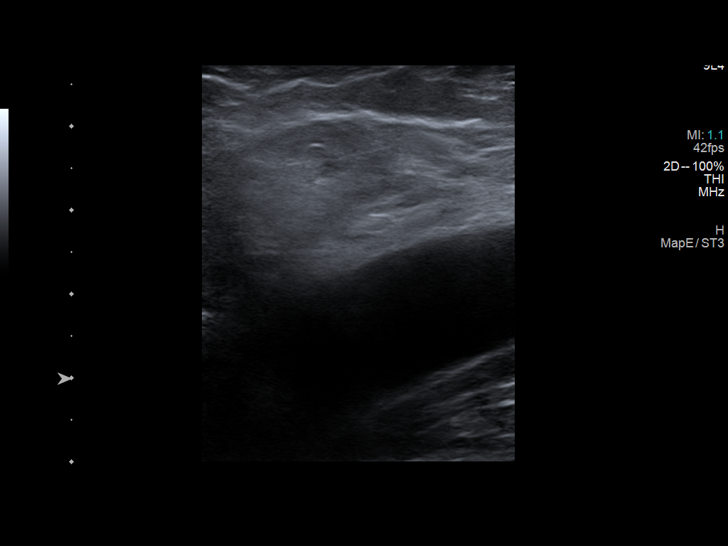
[im 15/55]
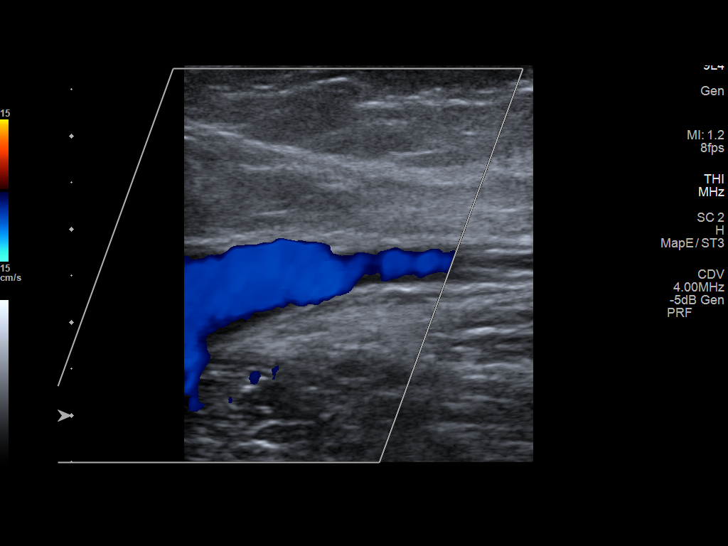
[im 19/55]
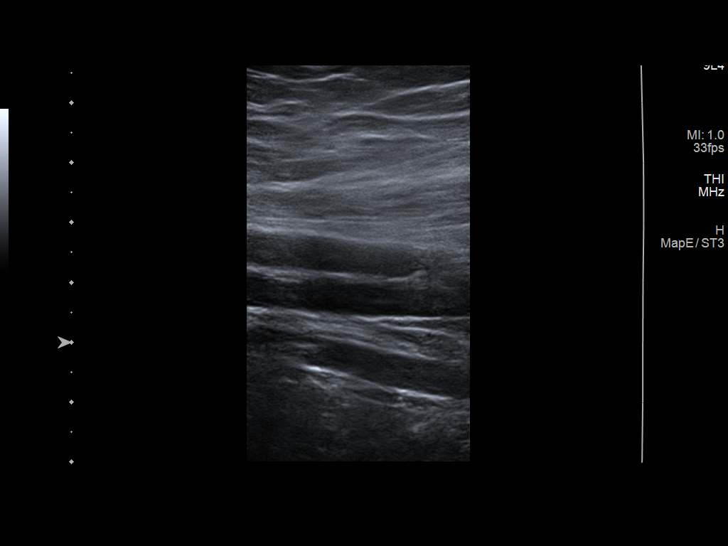
[im 24/55]
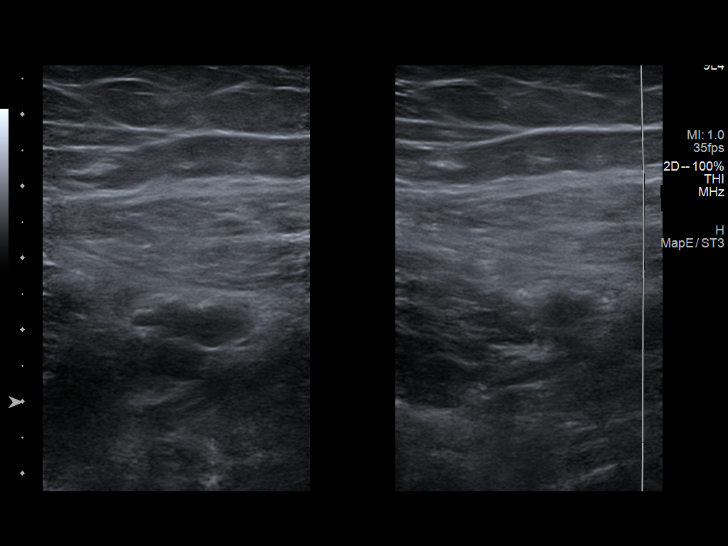
[im 29/55]
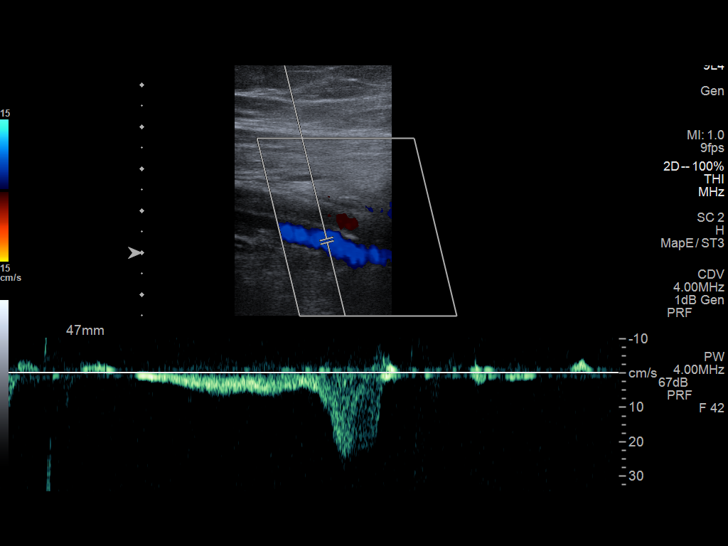
[im 31/55]
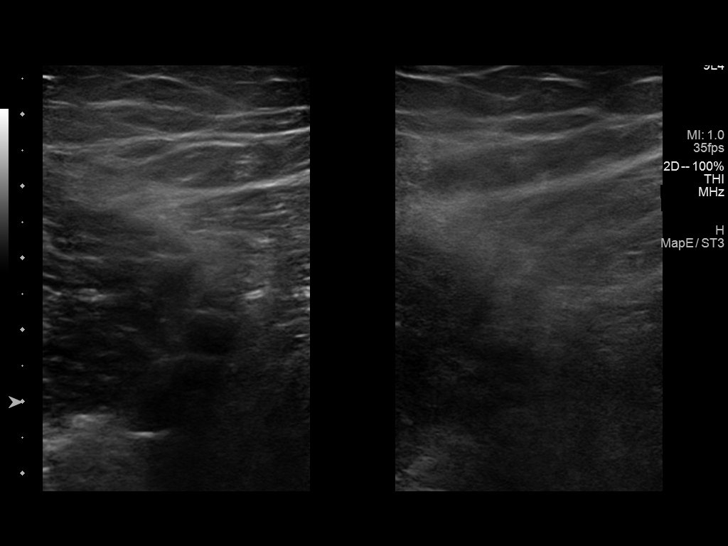
[im 36/55]
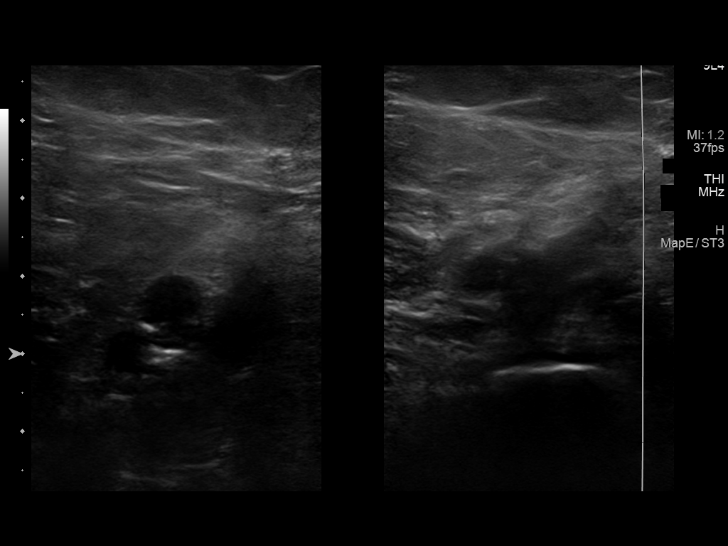
[im 40/55]
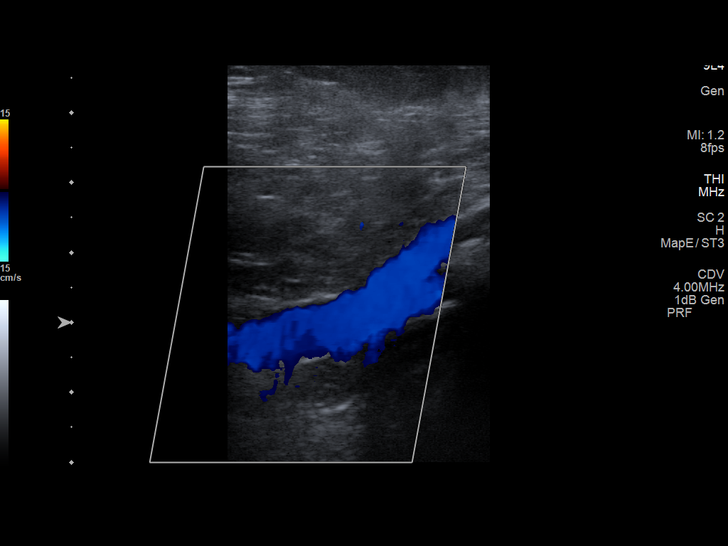
[im 45/55]
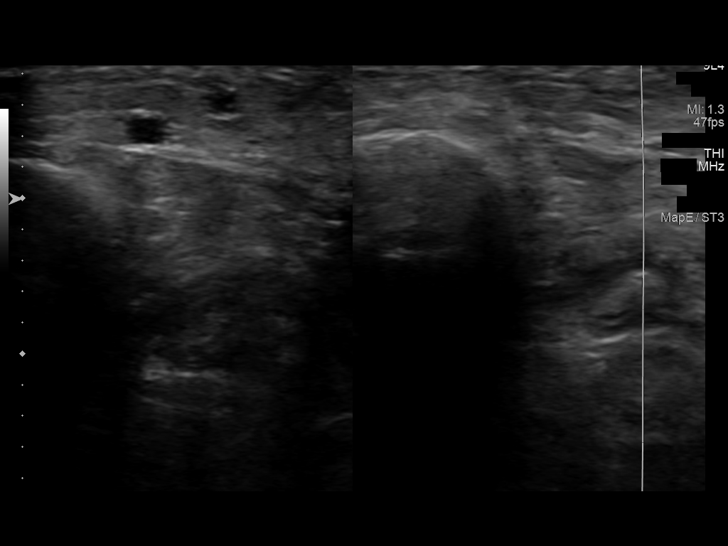
[im 50/55]
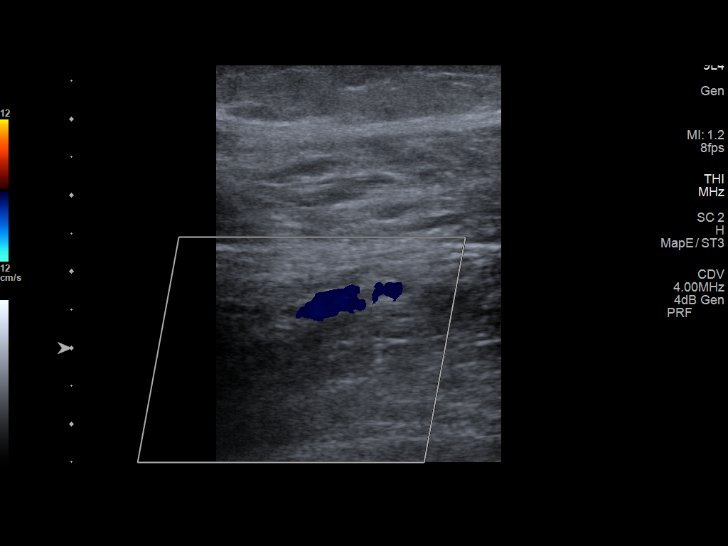
[im 55/55]
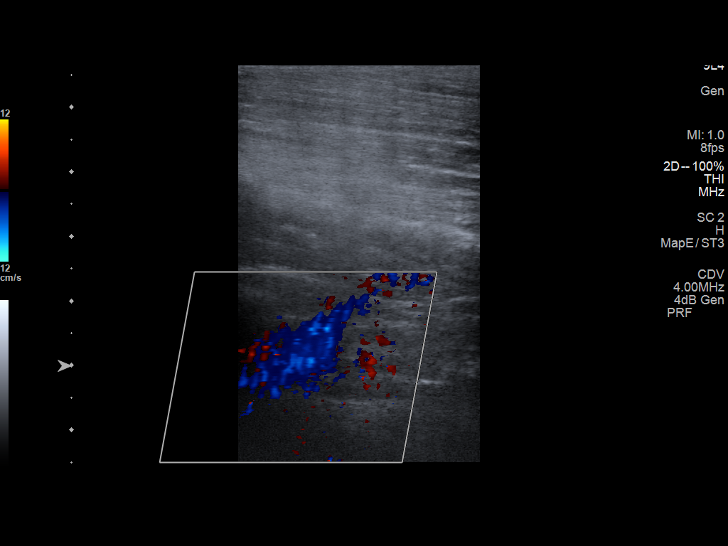

[13 of 24 positions shown; findings below may reference images not displayed]

FINDINGS: Contralateral Common Femoral Vein: Respiratory phasicity is normal
and symmetric with the symptomatic side. No evidence of thrombus.
Normal compressibility.

Common Femoral Vein: No evidence of thrombus. Normal
compressibility, respiratory phasicity and response to augmentation.

Saphenofemoral Junction: No evidence of thrombus. Normal
compressibility and flow on color Doppler imaging.

Profunda Femoral Vein: No evidence of thrombus. Normal
compressibility and flow on color Doppler imaging.

Femoral Vein: No evidence of thrombus. Normal compressibility,
respiratory phasicity and response to augmentation.

Popliteal Vein: No evidence of thrombus. Normal compressibility,
respiratory phasicity and response to augmentation.

Calf Veins: No evidence of thrombus. Normal compressibility and flow
on color Doppler imaging.

Superficial Great Saphenous Vein: No evidence of thrombus. Normal
compressibility.

Venous Reflux:  None.

Other Findings: No evidence of superficial thrombophlebitis or
abnormal fluid collection.
IMPRESSION: No evidence of right lower extremity deep venous thrombosis.
